# Patient Record
Sex: Male | Born: 1950
Health system: Southern US, Community
[De-identification: ages and names within clinical notes are randomized; demographics above are authoritative.]

## PROBLEM LIST (undated history)

## (undated) ENCOUNTER — Ambulatory Visit: Source: Home / Self Care

## (undated) DIAGNOSIS — H269 Unspecified cataract: Secondary | ICD-10-CM

## (undated) DIAGNOSIS — E119 Type 2 diabetes mellitus without complications: Secondary | ICD-10-CM

## (undated) DIAGNOSIS — E785 Hyperlipidemia, unspecified: Secondary | ICD-10-CM

## (undated) HISTORY — DX: Type 2 diabetes mellitus without complications: E11.9

## (undated) HISTORY — DX: Unspecified cataract: H26.9

## (undated) HISTORY — DX: Hyperlipidemia, unspecified: E78.5

## (undated) HISTORY — PX: STRABISMUS SURGERY: SHX218

---

## 2005-01-16 ENCOUNTER — Ambulatory Visit: Payer: Self-pay | Admitting: Internal Medicine

## 2007-07-01 HISTORY — PX: COLONOSCOPY: SHX174

## 2008-03-16 ENCOUNTER — Observation Stay (HOSPITAL_COMMUNITY): Admission: EM | Admit: 2008-03-16 | Discharge: 2008-03-17 | Payer: Self-pay | Admitting: Emergency Medicine

## 2008-03-16 ENCOUNTER — Ambulatory Visit: Payer: Self-pay | Admitting: Internal Medicine

## 2008-03-17 ENCOUNTER — Encounter: Payer: Self-pay | Admitting: Internal Medicine

## 2008-03-21 ENCOUNTER — Encounter: Payer: Self-pay | Admitting: Internal Medicine

## 2010-07-30 NOTE — Procedures (Signed)
Summary: Colonoscopy   Colonoscopy  Procedure date:  03/17/2008  Findings:      Location:  [facility].    Procedures Next Due Date:    Colonoscopy: 03/2018  Patient Name: Norman Bradley, Norman Bradley MRN: [id number] Procedure Procedures: Colonoscopy CPT: 47829.    with polypectomy. CPT: A3573898.  Personnel: Endoscopist: Iva Boop, MD, [facility].  Exam Location: Exam performed in Endoscopy Suite. Outpatient  Patient Consent: Procedure, Alternatives, Risks and Benefits discussed, consent obtained, from patient. Consent was obtained by the RN.  Indications Symptoms: Hematochezia.  History  Current Medications: Patient is not currently taking Coumadin.  Pre-Exam Physical: Performed Mar 17, 2008. Cardio-pulmonary exam WNL. Rectal exam abnormal. HEENT exam , Abdominal exam, Mental status exam WNL. Abnormal PE findings include: scanty red blood on rectal.  Comments: Pt. history reviewed/updated, physical exam performed prior to initiation of sedation? YES Exam Exam: Extent of exam reached: Cecum, extent intended: Cecum.  The cecum was identified by appendiceal orifice and IC valve. Patient position: on left side. Time to Cecum: 00:08:11. Time for Withdrawl: 00:09:32. Colon retroflexion performed. Images taken. ASA Classification: I. Tolerance: good.  Monitoring: Pulse and BP monitoring, Oximetry used. Supplemental O2 given.  Colon Prep Used MiraLax for colon prep. Prep results: excellent.  Sedation Meds: Patient assessed and found to be appropriate for moderate (conscious) sedation. Fentanyl 60 given IV. Versed 6 mg. given IV.  Findings - NORMAL EXAM: Cecum to Sigmoid Colon.  POLYP: Rectum, Maximum size: 4 mm. sessile polyp. Procedure:  snare without cautery, removed, retrieved, Polyp sent to pathology.  HEMORRHOIDS: Internal. Size: Grade I.    Comments: No blood seen after rectal completed Assessment  Comments: 1) 4 MM RECTAL POLYP REMOVED 2) INTERNAL  HEMORHOIDS, I THINK THESE WERE SOURCE OF BLEEDING 3) OTHERWISE NORMAL COLONOSCOPY, EXCELLENT PREP Events  Unplanned Interventions: No intervention was required.  Plans Comments: ANUSOL HC 25 MG AT BEDTIME X 7 NIGHTS (AND AFTER BOWEL MOVEMENT PRN)  Disposition: After procedure patient sent to recovery.  Scheduling/Referral: Await pathology to schedule patient.  Comments: IF POLYP ADENOMA, REPEAT COLONOSCOPY 5 YRS  IF HYPERPLASTIC, 10 YRS   cc: Garlan Fillers, MD    This report was created from the original endoscopy report, which was reviewed and signed by the above listed endoscopist.

## 2010-07-30 NOTE — Letter (Signed)
Summary: Patient Notice-Hyperplastic Polyps  [facility]  [address] [place], [place] [address]   Phone: [phone]  Fax: [phone]        March 21, 2008 MRN: [id number]    [facility] [address] [place], [place]  [address]    Dear Mr. Shackleton,  I am pleased to inform you that the colon polyp removed during your recent colonoscopy was hyperplastic.  These types of polyps are NOT pre-cancerous.  It is therefore my recommendation that you have a repeat colonoscopy examination in 10 years for routine colorectal cancer screening.  Should you develop new or worsening symptoms of abdominal pain, bowel habit changes or bleeding from the rectum or bowels, please schedule an evaluation with either your primary care physician or with me.  Continue the treatment plan for hemorrhoids as outlined the day of your exam.  Please call us if you are having persistent problems or have questions about your condition that have not been fully answered at this time.  Sincerely,  Iva Boop MD  This letter has been electronically signed by your physician.  Appended Document: Patient Notice-Hyperplastic Polyps letter mailed to pt home

## 2011-03-31 LAB — CBC
HCT: 47.8
Hemoglobin: 16.5
MCHC: 34.5
MCV: 91.5
Platelets: 164
RBC: 5.22
RDW: 12.6
WBC: 6.1

## 2011-03-31 LAB — SAMPLE TO BLOOD BANK

## 2011-03-31 LAB — PROTIME-INR
INR: 1.1
Prothrombin Time: 14.3

## 2011-03-31 LAB — APTT: aPTT: 33

## 2011-03-31 LAB — COMPREHENSIVE METABOLIC PANEL
ALT: 45
AST: 38 — ABNORMAL HIGH
Albumin: 3.7
Alkaline Phosphatase: 64
BUN: 13
CO2: 26
Calcium: 9.1
Chloride: 108
Creatinine, Ser: 1.02
GFR calc Af Amer: >60
GFR calc non Af Amer: >60
Glucose, Bld: 114 — ABNORMAL HIGH
Potassium: 4.6
Sodium: 139
Total Bilirubin: 1.7 — ABNORMAL HIGH
Total Protein: 6.7

## 2011-03-31 LAB — DIFFERENTIAL
Basophils Absolute: 0
Basophils Relative: 1
Eosinophils Absolute: 0.1
Eosinophils Relative: 3
Lymphocytes Relative: 32
Lymphs Abs: 2
Monocytes Absolute: 0.5
Monocytes Relative: 9
Neutro Abs: 3.4
Neutrophils Relative %: 56

## 2011-03-31 LAB — HEMOGLOBIN AND HEMATOCRIT, BLOOD
HCT: 46
Hemoglobin: 15.9

## 2014-04-25 ENCOUNTER — Encounter: Payer: Self-pay | Admitting: Endocrinology

## 2014-04-25 ENCOUNTER — Ambulatory Visit (INDEPENDENT_AMBULATORY_CARE_PROVIDER_SITE_OTHER): Payer: BC Managed Care – PPO | Admitting: Endocrinology

## 2014-04-25 VITALS — BP 128/87 | HR 95 | Temp 98.1°F | Ht 71.0 in | Wt 246.0 lb

## 2014-04-25 DIAGNOSIS — E119 Type 2 diabetes mellitus without complications: Secondary | ICD-10-CM

## 2014-04-25 LAB — HEMOGLOBIN A1C: Hgb A1c MFr Bld: 6.5 % (ref 4.6–6.5)

## 2014-04-25 NOTE — Progress Notes (Signed)
Subjective:    Patient ID: Norman Bradley, male    DOB: 02-13-1951, 63 y.o.   MRN: [id number]  HPI pt states DM was dx'ed in mid-2015; he has mild if any neuropathy of the lower extremities; he is unaware of any associated chronic complications; he has never been on insulin; pt says his diet and exercise are very good; he has never had pancreatitis, severe hypoglycemia or DKA.  He was rx'ed metformin.  cbg's since then have been in the low-100's in am.  He declines weight-loss surgery.   No past medical history on file.  No past surgical history on file.  History   Social History  . Marital Status: Single    Spouse Name: N/A    Number of Children: N/A  . Years of Education: N/A   Occupational History  . Not on file.   Social History Main Topics  . Smoking status: Former Research scientist (life sciences)  . Smokeless tobacco: Not on file  . Alcohol Use: No  . Drug Use: Not on file  . Sexual Activity: Not on file   Other Topics Concern  . Not on file   Social History Narrative  . No narrative on file    No current outpatient prescriptions on file prior to visit.   No current facility-administered medications on file prior to visit.    Not on File  Family History  Problem Relation Age of Onset  . Diabetes Maternal Grandmother   . Diabetes Maternal Grandfather     BP 128/87  Pulse 95  Temp(Src) 98.1 F (36.7 C) (Oral)  Ht 5\' 11"  (1.803 m)  Wt 246 lb (111.585 kg)  BMI 34.33 kg/m2  SpO2 97%  Review of Systems denies fever, blurry vision, headache, chest pain, sob, n/v, urinary frequency, muscle cramps, excessive diaphoresis, depression, cold intolerance, rhinorrhea, and easy bruising.  He has lost a few lbs, due to his efforts.     Objective:   Physical Exam VS: see vs page GEN: no distress HEAD: head: no deformity eyes: no periorbital swelling, no proptosis external nose and ears are normal mouth: no lesion seen NECK: supple, thyroid is not enlarged CHEST WALL: no  deformity LUNGS: clear to auscultation CV: reg rate and rhythm, no murmur ABD: abdomen is soft, nontender.  no hepatosplenomegaly.  not distended.  no hernia MUSCULOSKELETAL: muscle bulk and strength are grossly normal.  no obvious joint swelling.  gait is normal and steady EXTEMITIES: no deformity.  no ulcer on the feet.  feet are of normal color and temp.  no edema PULSES: dorsalis pedis intact bilat.  no carotid bruit NEURO:  cn 2-12 grossly intact.   readily moves all 4's.  sensation is intact to touch on the feet SKIN:  Normal texture and temperature.  No rash or suspicious lesion is visible.   NODES:  None palpable at the neck PSYCH: alert, well-oriented.  Does not appear anxious nor depressed.   i have reviewed the following old records: Office notes  i reviewed electrocardiogram  Lab Results  Component Value Date   HGBA1C 6.5 04/25/2014      Assessment & Plan:  DM: new to me.  Glycemic control is much better  Patient is advised the following: Patient Instructions  blood tests are being requested for you today.  We'll contact you with results. good diet and exercise habits significanly improve the control of your diabetes.  please let me know if you wish to be referred to a dietician.  high blood  sugar is very risky to your health.  you should see an eye doctor and dentist every year.  It is very important to get all recommended vaccinations.  controlling your blood pressure and cholesterol drastically reduces the damage diabetes does to your body.  Those who smoke should quit.  please discuss these with your doctor.  check your blood sugar once a day.  vary the time of day when you check, between before the 3 meals, and at bedtime.  also check if you have symptoms of your blood sugar being too high or too low.  please keep a record of the readings and bring it to your next appointment here.  You can write it on any piece of paper.  please call us sooner if your blood sugar goes  below 70, or if you have a lot of readings over 200.   I would be happy to see you back here whenever you want.

## 2014-04-25 NOTE — Patient Instructions (Signed)
blood tests are being requested for you today.  We'll contact you with results. good diet and exercise habits significanly improve the control of your diabetes.  please let me know if you wish to be referred to a dietician.  high blood sugar is very risky to your health.  you should see an eye doctor and dentist every year.  It is very important to get all recommended vaccinations.  controlling your blood pressure and cholesterol drastically reduces the damage diabetes does to your body.  Those who smoke should quit.  please discuss these with your doctor.  check your blood sugar once a day.  vary the time of day when you check, between before the 3 meals, and at bedtime.  also check if you have symptoms of your blood sugar being too high or too low.  please keep a record of the readings and bring it to your next appointment here.  You can write it on any piece of paper.  please call us sooner if your blood sugar goes below 70, or if you have a lot of readings over 200.   I would be happy to see you back here whenever you want.

## 2014-12-25 ENCOUNTER — Other Ambulatory Visit: Payer: Self-pay

## 2017-05-27 ENCOUNTER — Ambulatory Visit (INDEPENDENT_AMBULATORY_CARE_PROVIDER_SITE_OTHER): Payer: Medicare Other | Admitting: Family Medicine

## 2017-05-27 ENCOUNTER — Encounter: Payer: Self-pay | Admitting: Family Medicine

## 2017-05-27 VITALS — BP 130/80 | HR 96 | Temp 98.4°F | Ht 71.0 in | Wt 252.4 lb

## 2017-05-27 DIAGNOSIS — Z125 Encounter for screening for malignant neoplasm of prostate: Secondary | ICD-10-CM

## 2017-05-27 DIAGNOSIS — Z0000 Encounter for general adult medical examination without abnormal findings: Secondary | ICD-10-CM | POA: Diagnosis not present

## 2017-05-27 DIAGNOSIS — R0683 Snoring: Secondary | ICD-10-CM

## 2017-05-27 DIAGNOSIS — Z1159 Encounter for screening for other viral diseases: Secondary | ICD-10-CM | POA: Insufficient documentation

## 2017-05-27 DIAGNOSIS — R7989 Other specified abnormal findings of blood chemistry: Secondary | ICD-10-CM

## 2017-05-27 DIAGNOSIS — E119 Type 2 diabetes mellitus without complications: Secondary | ICD-10-CM | POA: Diagnosis not present

## 2017-05-27 DIAGNOSIS — E7800 Pure hypercholesterolemia, unspecified: Secondary | ICD-10-CM

## 2017-05-27 DIAGNOSIS — R945 Abnormal results of liver function studies: Secondary | ICD-10-CM

## 2017-05-27 DIAGNOSIS — Z1211 Encounter for screening for malignant neoplasm of colon: Secondary | ICD-10-CM

## 2017-05-27 LAB — CBC
HCT: 51 % (ref 39.0–52.0)
Hemoglobin: 17.2 g/dL — ABNORMAL HIGH (ref 13.0–17.0)
MCHC: 33.8 g/dL (ref 30.0–36.0)
MCV: 90.9 fl (ref 78.0–100.0)
Platelets: 165 10*3/uL (ref 150.0–400.0)
RBC: 5.61 Mil/uL (ref 4.22–5.81)
RDW: 14 % (ref 11.5–15.5)
WBC: 8 10*3/uL (ref 4.0–10.5)

## 2017-05-27 LAB — URINALYSIS, ROUTINE W REFLEX MICROSCOPIC
Bilirubin Urine: NEGATIVE
Hgb urine dipstick: NEGATIVE
Ketones, ur: NEGATIVE
Leukocytes, UA: NEGATIVE
Nitrite: NEGATIVE
RBC / HPF: NONE SEEN (ref 0–?)
Specific Gravity, Urine: 1.025 (ref 1.000–1.030)
Total Protein, Urine: NEGATIVE
Urine Glucose: NEGATIVE
Urobilinogen, UA: 0.2 (ref 0.0–1.0)
WBC, UA: NONE SEEN (ref 0–?)
pH: 5.5 (ref 5.0–8.0)

## 2017-05-27 LAB — COMPREHENSIVE METABOLIC PANEL
ALT: 61 U/L — ABNORMAL HIGH (ref 0–53)
AST: 38 U/L — ABNORMAL HIGH (ref 0–37)
Albumin: 4.5 g/dL (ref 3.5–5.2)
Alkaline Phosphatase: 65 U/L (ref 39–117)
BUN: 21 mg/dL (ref 6–23)
CO2: 29 mEq/L (ref 19–32)
Calcium: 9.9 mg/dL (ref 8.4–10.5)
Chloride: 98 mEq/L (ref 96–112)
Creatinine, Ser: 1.04 mg/dL (ref 0.40–1.50)
GFR: 75.91 mL/min (ref >60.00)
Glucose, Bld: 161 mg/dL — ABNORMAL HIGH (ref 70–99)
Potassium: 4.8 mEq/L (ref 3.5–5.1)
Sodium: 136 mEq/L (ref 135–145)
Total Bilirubin: 1.8 mg/dL — ABNORMAL HIGH (ref 0.2–1.2)
Total Protein: 7.9 g/dL (ref 6.0–8.3)

## 2017-05-27 LAB — TSH: TSH: 3.48 u[IU]/mL (ref 0.35–4.50)

## 2017-05-27 LAB — MICROALBUMIN / CREATININE URINE RATIO
Creatinine,U: 109.4 mg/dL
Microalb Creat Ratio: 2.5 mg/g (ref 0.0–30.0)
Microalb, Ur: 2.8 mg/dL — ABNORMAL HIGH (ref 0.0–1.9)

## 2017-05-27 LAB — LIPID PANEL
Cholesterol: 164 mg/dL (ref 0–200)
HDL: 27.8 mg/dL — ABNORMAL LOW (ref >39.00)
NonHDL: 136.23
Total CHOL/HDL Ratio: 6
Triglycerides: 255 mg/dL — ABNORMAL HIGH (ref 0.0–149.0)
VLDL: 51 mg/dL — ABNORMAL HIGH (ref 0.0–40.0)

## 2017-05-27 LAB — HEMOGLOBIN A1C: Hgb A1c MFr Bld: 7.1 % — ABNORMAL HIGH (ref 4.6–6.5)

## 2017-05-27 LAB — PSA: PSA: 1.22 ng/mL (ref 0.10–4.00)

## 2017-05-27 LAB — LDL CHOLESTEROL, DIRECT: Direct LDL: 99 mg/dL

## 2017-05-27 MED ORDER — METFORMIN HCL 1000 MG PO TABS: 1000.0000 mg | ORAL_TABLET | Freq: Two times a day (BID) | ORAL | 0 refills | Status: DC

## 2017-05-27 NOTE — Progress Notes (Addendum)
Subjective:  Patient ID: Norman Bradley, male    DOB: 07-30-1950  Age: 66 y.o. MRN: [id number]  CC: Establish Care   HPI Norman Bradley presents to establish care. Longstanding ho dm tw metformin alone. Has gained weight and cbgs have been running in the 140-160 range. Has not been exercising recently other than playing golf. He does walk for golf. Recent eye exam. nml colonoscopy 5 years ago. Cholesterol has been high and not treated. He snores but feels rested in am. Wife doesn't report apnea but she snores too. He does take an pm nap. Fh largely unknown. He has 7 children and 14 grandchildren and 2 great grandchildren. Not interested in retiring. He runs a company. He has had all vaccines he says.   History Norman Bradley has a past medical history of Diabetes mellitus without complication ([place]).   He has no past surgical history on file.   His family history includes Alcohol abuse in his mother; Cancer in his brother and mother; Depression in his mother; Diabetes in his maternal grandfather and maternal grandmother; Early death in his mother.He reports that he has quit smoking. he has never used smokeless tobacco. He reports that he drinks alcohol. He reports that he does not use drugs.  Outpatient Medications Prior to Visit  Medication Sig Dispense Refill  . metFORMIN (GLUCOPHAGE) 500 MG tablet Take by mouth 4 (four) times daily.     No facility-administered medications prior to visit.     ROS Review of Systems  Constitutional: Negative.   HENT: Negative.   Eyes: Negative.   Respiratory: Negative for chest tightness and shortness of breath.   Cardiovascular: Negative.  Negative for chest pain and palpitations.  Gastrointestinal: Negative.   Endocrine: Negative for cold intolerance, polyphagia and polyuria.  Genitourinary: Negative for decreased urine volume, difficulty urinating, frequency and hematuria.  Skin: Negative.   Allergic/Immunologic: Negative for  immunocompromised state.  Neurological: Negative for weakness and headaches.  Hematological: Negative.   Psychiatric/Behavioral: Negative.     Objective:  BP 130/80 (BP Location: Left Arm, Patient Position: Sitting, Cuff Size: Normal)   Pulse 96   Temp 98.4 F (36.9 C) (Oral)   Ht 5\' 11"  (1.803 m)   Wt 252 lb 6 oz (114.5 kg)   SpO2 97%   BMI 35.20 kg/m   Physical Exam  Constitutional: He is oriented to person, place, and time. He appears well-developed and well-nourished. No distress.  HENT:  Head: Normocephalic and atraumatic.  Right Ear: External ear normal.  Left Ear: External ear normal.  Mouth/Throat: Oropharynx is clear and moist. No oropharyngeal exudate.    Eyes: Conjunctivae are normal. Pupils are equal, round, and reactive to light. Right eye exhibits no discharge. Left eye exhibits no discharge. No scleral icterus.  Neck: Neck supple. No JVD present. No tracheal deviation present. No thyromegaly present.    Cardiovascular: Normal rate, regular rhythm and normal heart sounds.  Pulmonary/Chest: Effort normal and breath sounds normal. No stridor.  Abdominal: Bowel sounds are normal.  Musculoskeletal: Normal range of motion. He exhibits no edema or tenderness.  Lymphadenopathy:    He has no cervical adenopathy.  Neurological: He is alert and oriented to person, place, and time.  Skin: Skin is warm and dry. He is not diaphoretic.  Psychiatric: He has a normal mood and affect. His behavior is normal.      Assessment & Plan:   Norman Bradley was seen today for establish care.  Diagnoses and all orders for this visit:  Type 2 diabetes mellitus without complication, without long-term current use of insulin (HCC) -     CBC -     Comprehensive metabolic panel -     Hemoglobin A1c -     Lipid panel -     TSH -     Urinalysis, Routine w reflex microscopic -     Microalbumin / creatinine urine ratio -     metFORMIN (GLUCOPHAGE) 1000 MG tablet; Take 1 tablet (1,000 mg  total) by mouth 2 (two) times daily with a meal.  Screen for colon cancer  Screening for prostate cancer -     PSA  Healthcare maintenance -     Lipid panel -     TSH  Snores -     Cancel: Ambulatory referral to Pulmonology -     Ambulatory referral to Pulmonology  Encounter for hepatitis C screening test for low risk patient -     Hepatitis C antibody  Elevated LDL cholesterol level -     atorvastatin (LIPITOR) 20 MG tablet; Take 1 tablet (20 mg total) by mouth daily.  Elevated LFTs -     Hepatitis B Core Antibody, total -     Hepatitis B Surface AntiGEN -     Ethyl Glucuronide, Urine  Other orders -     LDL cholesterol, direct  He knows that losing weight is key here! I have discontinued Norman Bradley. Norman Bradley's metFORMIN. I am also having him start on metFORMIN and atorvastatin.  Meds ordered this encounter  Medications  . metFORMIN (GLUCOPHAGE) 1000 MG tablet    Sig: Take 1 tablet (1,000 mg total) by mouth 2 (two) times daily with a meal.    Dispense:  180 tablet    Refill:  0  . atorvastatin (LIPITOR) 20 MG tablet    Sig: Take 1 tablet (20 mg total) by mouth daily.    Dispense:  90 tablet    Refill:  0     Follow-up: Return in about 3 months (around 08/27/2017).  Libby Maw, MD

## 2017-05-28 ENCOUNTER — Telehealth: Payer: Self-pay | Admitting: Family Medicine

## 2017-05-28 DIAGNOSIS — E7800 Pure hypercholesterolemia, unspecified: Secondary | ICD-10-CM | POA: Insufficient documentation

## 2017-05-28 DIAGNOSIS — R945 Abnormal results of liver function studies: Secondary | ICD-10-CM

## 2017-05-28 DIAGNOSIS — R7989 Other specified abnormal findings of blood chemistry: Secondary | ICD-10-CM | POA: Insufficient documentation

## 2017-05-28 LAB — HEPATITIS C ANTIBODY
Hepatitis C Ab: NONREACTIVE
SIGNAL TO CUT-OFF: 0.01 (ref <1.00)

## 2017-05-28 MED ORDER — ATORVASTATIN CALCIUM 20 MG PO TABS: 20.0000 mg | ORAL_TABLET | Freq: Every day | ORAL | 0 refills | Status: DC

## 2017-05-28 NOTE — Addendum Note (Signed)
Addended by: Jon Billings on: 05/28/2017 08:11 AM   Modules accepted: Orders

## 2017-05-28 NOTE — Telephone Encounter (Signed)
Pt did not want to start Lipitor at this time. Wants to do diet and exercise. Pt states he did have 1 glass of wine the evening before labs.   Results given    Notes recorded by Libby Maw, MD on 05/28/2017 at 9:49 AM EST DM is under worse control with increased weight. Please exercise and lose weight. Continue glucophage only for now. Fu in 3 mos.  Ldl cholesterol and needs treatment. Have sent rx for lipitor to pharmacy. Fu in 3 mos.  Lfts are elevated. Did pt have any alcohol on the evening prior to being seen here at the clinic?

## 2017-05-28 NOTE — Telephone Encounter (Signed)
FYI

## 2017-05-29 ENCOUNTER — Telehealth: Payer: Self-pay | Admitting: Family Medicine

## 2017-05-29 NOTE — Telephone Encounter (Signed)
Copied from [place] [phone]. Topic: Quick Communication - See Telephone Encounter >> May 29, 2017  3:47 PM Synthia Innocent wrote: CRM for notification. See Telephone encounter for: Requesting refill on test strips, one touch  05/29/17.

## 2017-06-01 MED ORDER — GLUCOSE BLOOD VI STRP: ORAL_STRIP | 3 refills | Status: DC

## 2017-06-01 NOTE — Telephone Encounter (Signed)
Okay for test strips.

## 2017-06-01 NOTE — Telephone Encounter (Signed)
Pt requesting refill on one touch test strips.Test strips not listed on current medication list.

## 2017-06-01 NOTE — Telephone Encounter (Signed)
Rx has been sent in and patient is aware 

## 2017-06-01 NOTE — Telephone Encounter (Signed)
Okay to refill test strips 

## 2017-06-02 ENCOUNTER — Telehealth: Payer: Self-pay

## 2017-06-02 NOTE — Telephone Encounter (Signed)
I have called patient and left message for him to call and schedule appointment with Dr. Ethelene Hal.

## 2017-06-02 NOTE — Progress Notes (Signed)
Phone note created 

## 2017-06-02 NOTE — Telephone Encounter (Signed)
Can you please help Mr. Norman Bradley to get on Dr. Bebe Shaggy schedule? He would like to discuss some results with him in person & possibly order more blood work.   Thank you!

## 2017-06-04 ENCOUNTER — Encounter: Payer: Self-pay | Admitting: Family Medicine

## 2017-06-04 NOTE — Telephone Encounter (Signed)
Message sent to patient

## 2017-06-16 NOTE — Telephone Encounter (Signed)
I called and left message on patient voicemail to call office and schedule appointment.  °

## 2017-08-20 ENCOUNTER — Encounter: Payer: Self-pay | Admitting: Neurology

## 2017-08-20 ENCOUNTER — Ambulatory Visit: Payer: Medicare Other | Admitting: Neurology

## 2017-08-20 VITALS — BP 145/91 | HR 76 | Ht 71.0 in | Wt 251.0 lb

## 2017-08-20 DIAGNOSIS — E669 Obesity, unspecified: Secondary | ICD-10-CM | POA: Diagnosis not present

## 2017-08-20 DIAGNOSIS — R0683 Snoring: Secondary | ICD-10-CM | POA: Diagnosis not present

## 2017-08-20 DIAGNOSIS — R0681 Apnea, not elsewhere classified: Secondary | ICD-10-CM

## 2017-08-20 NOTE — Progress Notes (Signed)
Subjective:    Patient ID: Norman Bradley is a 67 y.o. male.  HPI     Norman Age, Norman Bradley, Norman Bradley [facility] [address] [place], [place] [address]  Dear Dr. Ethelene Hal,   I saw your patient, Norman Bradley, upon your kind request in my neurologic clinic today for initial consultation of his sleep disorder, in particular, concern for underlying obstructive sleep apnea. The patient is unaccompanied today. As you know, Norman Bradley is a 67 year old right-handed gentleman with an underlying medical history of type 2 diabetes, and obesity, who reports snoring and occasional sleep disruption, he does have vivid dreams, attributes this to his long time in the [employer]. His wife has noted the occasional positive in his breathing. He does not have any night to night nocturia and denies morning headaches or family history of obstructive sleep apnea, biological father's medical history is unknown to him. He is not sure at what Bradley his mom passed away. He quit smoking cigarettes some 35 years ago but smokes the occasional cigar. His weight fluctuates, typically a little higher in the winter months. He resides in [place] typically. His Epworth sleepiness score is 8 out of 24 today, fatigue score is 15 out of 63. Bedtime is around 10, he occasionally watches TV in bed. Rise time is 6:30. He has 7 children, none live in the household, he has no pets. He drinks caffeine in the form of coffee, typically 1 cup per day on average, occasional soda. He drinks alcohol in the form of wine typically, maybe 1 or 2 glasses a day or so. I reviewed your office note from 05/27/2017.  His Past Medical History Is Significant For: Past Medical History:  Diagnosis Date  . Diabetes mellitus without complication ([place])     His Past Surgical History Is Significant For: No past surgical history on file.  His Family History Is Significant For: Family History  Problem Relation Bradley of  Onset  . Diabetes Maternal Grandmother   . Diabetes Maternal Grandfather   . Alcohol abuse Mother   . Cancer Mother   . Depression Mother   . Early death Mother   . Cancer Brother     His Social History Is Significant For: Social History   Socioeconomic History  . Marital status: Single    Spouse name: None  . Number of children: None  . Years of education: None  . Highest education level: None  Social Needs  . Financial resource strain: None  . Food insecurity - worry: None  . Food insecurity - inability: None  . Transportation needs - medical: None  . Transportation needs - non-medical: None  Occupational History  . None  Tobacco Use  . Smoking status: Former Research scientist (life sciences)  . Smokeless tobacco: Never Used  Substance and Sexual Activity  . Alcohol use: Yes  . Drug use: No  . Sexual activity: Yes    Partners: Female  Other Topics Concern  . None  Social History Narrative  . None    His Allergies Are:  No Known Allergies:   His Current Medications Are:  Outpatient Encounter Medications as of 08/20/2017  Medication Sig  . aspirin EC 81 MG tablet Take 81 mg by mouth daily.  Marland Kitchen glucose blood (ONE TOUCH ULTRA TEST) test strip Use to test blood sugars once daily.  . metFORMIN (GLUCOPHAGE) 1000 MG tablet Take 1 tablet (1,000 mg total) by mouth 2 (two) times daily with a meal.  . [DISCONTINUED]  atorvastatin (LIPITOR) 20 MG tablet Take 1 tablet (20 mg total) by mouth daily.   No facility-administered encounter medications on file as of 08/20/2017.   :  Review of Systems:  Out of a complete 14 point review of systems, all are reviewed and negative with the exception of these symptoms as listed below: Review of Systems  Neurological:       Pt presents today to discuss his snoring. Pt has never had a sleep study but thinks that he does snore. Pt is not interested in using a cpap.  Epworth Sleepiness Scale 0= would never doze 1= slight chance of dozing 2= moderate chance of  dozing 3= high chance of dozing  Sitting and reading: 1 Watching TV: 2 Sitting inactive in a public place (ex. Theater or meeting): 0  As a passenger in a car for an hour without a break: 2 Lying down to rest in the afternoon: 3 Sitting and talking to someone: 0 Sitting quietly after lunch (no alcohol): 0 In a car, while stopped in traffic: 0 Total: 8     Objective:  Neurological Exam  Physical Exam Physical Examination:   Vitals:   08/20/17 1049  BP: (!) 145/91  Pulse: 76    General Examination: The patient is a very pleasant 67 y.o. male in no acute distress. He appears well-developed and well-nourished and well groomed.   HEENT: Normocephalic, atraumatic, pupils are equal, round and reactive to light and accommodation. Funduscopic exam is normal with sharp disc margins noted. Extraocular tracking is good without limitation to gaze excursion or nystagmus noted. Normal smooth pursuit is noted. Hearing is grossly intact. Face is symmetric with normal facial animation and normal facial sensation. Speech is clear with no dysarthria noted. There is no hypophonia. There is no lip, neck/head, jaw or voice tremor. Neck is supple with full range of passive and active motion. There are no carotid bruits on auscultation. Oropharynx exam reveals: mild mouth dryness, adequate dental hygiene and mild airway crowding, due to smaller airway entry, thicker tongue, longer uvula, which ends wisp-like. Mallampati is class II. Tongue protrudes centrally and palate elevates symmetrically. Tonsils are very small or ?absent. Neck size is 17 5/8 inches. He has a mild to maybe moderate overbite.   Chest: Clear to auscultation without wheezing, rhonchi or crackles noted.  Heart: S1+S2+0, regular and normal without murmurs, rubs or gallops noted.   Abdomen: Soft, non-tender and non-distended with normal bowel sounds appreciated on auscultation.  Extremities: There is no pitting edema in the distal lower  extremities bilaterally. Pedal pulses are intact.  Skin: Warm and dry without trophic changes noted.  Musculoskeletal: exam reveals no obvious joint deformities, tenderness or joint swelling or erythema.   Neurologically:  Mental status: The patient is awake, alert and oriented in all 4 spheres. His immediate and remote memory, attention, language skills and fund of knowledge are appropriate. There is no evidence of aphasia, agnosia, apraxia or anomia. Speech is clear with normal prosody and enunciation. Thought process is linear. Mood is normal and affect is normal.  Cranial nerves II - XII are as described above under HEENT exam. In addition: shoulder shrug is normal with equal shoulder height noted. Motor exam: Normal bulk, strength and tone is noted. There is no drift, tremor or rebound. Fine motor skills and coordination: intact with normal finger taps, normal hand movements, normal rapid alternating patting, normal foot taps and normal foot agility.  Cerebellar testing: No dysmetria or intention tremor on finger to  nose testing. Heel to shin is unremarkable bilaterally. There is no truncal or gait ataxia.  Sensory exam: intact to light touch in the upper and lower extremities.  Gait, station and balance: He stands easily. No veering to one side is noted. No leaning to one side is noted. Posture is Bradley-appropriate and stance is narrow based. Gait shows normal stride length and normal pace. No problems turning are noted.   Assessment and Plan:  In summary, Norman Bradley is a very pleasant 67 y.o.-year old male with an underlying medical history of type 2 diabetes, and obesity, whose history and physical exam are concerning for obstructive sleep apnea (OSA). I had a long chat with the patient about my findings and the diagnosis of OSA, its prognosis and treatment options. We talked about medical treatments, surgical interventions and non-pharmacological approaches. I explained in particular  the risks and ramifications of untreated moderate to severe OSA, especially with respect to developing cardiovascular disease down the Road, including congestive heart failure, difficult to treat hypertension, cardiac arrhythmias, or stroke. Even type 2 diabetes has, in part, been linked to untreated OSA. Symptoms of untreated OSA include daytime sleepiness, memory problems, mood irritability and mood disorder such as depression and anxiety, lack of energy, as well as recurrent headaches, especially morning headaches. We talked about trying to maintain a healthy lifestyle in general, as well as the importance of weight control. I encouraged the patient to eat healthy, exercise daily and keep well hydrated, to keep a scheduled bedtime and wake time routine, to not skip any meals and eat healthy snacks in between meals. I advised the patient not to drive when feeling sleepy. I recommended the following at this time: sleep study with potential positive airway pressure titration. (We will score hypopneas at 4%).   I explained the sleep test procedure to the patient and also outlined possible surgical and non-surgical treatment options of OSA, including the use of a custom-made dental device (which would require a referral to a specialist dentist or oral surgeon), upper airway surgical options, such as pillar implants, radiofrequency surgery, tongue base surgery, and UPPP (which would involve a referral to an ENT surgeon). Rarely, jaw surgery such as mandibular advancement may be considered.  I also explained the CPAP treatment option to the patient, who indicated that he would be reluctant, but willing to try CPAP if the need arises. I explained the importance of being compliant with PAP treatment, not only for insurance purposes but primarily to improve His symptoms, and for the patient's long term health benefit, including to reduce His cardiovascular risks. I answered all his questions today and the patient was  in agreement. I would like to see him back after the sleep study is completed and encouraged him to call with any interim questions, concerns, problems or updates.   Thank you very much for allowing me to participate in the care of this nice patient. If I can be of any further assistance to you please do not hesitate to call me at [phone].  Sincerely,   Norman Age, Norman Bradley, Norman Bradley

## 2017-08-20 NOTE — Patient Instructions (Addendum)
Thank you for choosing [facility] for your sleep related care! It was nice to meet you today! I appreciate that you entrust me with your sleep related healthcare concerns. I hope, I was able to address at least some of your concerns today, and that I can help you feel reassured and also get better.    Here is what we discussed today and what we came up with as our plan for you:    Based on your symptoms and your exam I believe you are at risk for obstructive sleep apnea or OSA, and I think we should proceed with a sleep study to determine whether you do or do not have OSA and how severe it is. If you have more than mild OSA, I want you to consider treatment with CPAP. Please remember, the risks and ramifications of moderate to severe obstructive sleep apnea or OSA are: Cardiovascular disease, including congestive heart failure, stroke, difficult to control hypertension, arrhythmias, and even type 2 diabetes has been linked to untreated OSA. Sleep apnea causes disruption of sleep and sleep deprivation in most cases, which, in turn, can cause recurrent headaches, problems with memory, mood, concentration, focus, and vigilance. Most people with untreated sleep apnea report excessive daytime sleepiness, which can affect their ability to drive. Please do not drive if you feel sleepy.   I will likely see you back after your sleep study to go over the test results and where to go from there. We will call you after your sleep study to advise about the results (most likely, you will hear from Kristen, my nurse) and to set up an appointment at the time, as necessary.    Our sleep lab administrative assistan will call you to schedule your sleep study. If you don't hear back from her by about 2 weeks from now, please feel free to call her at [phone]. You can leave a message with your phone number and concerns, if you get the voicemail box. She will call back as soon as possible.   

## 2017-08-24 ENCOUNTER — Ambulatory Visit (INDEPENDENT_AMBULATORY_CARE_PROVIDER_SITE_OTHER): Payer: Medicare Other | Admitting: Family Medicine

## 2017-08-24 ENCOUNTER — Encounter: Payer: Self-pay | Admitting: Family Medicine

## 2017-08-24 VITALS — BP 120/90 | HR 70 | Wt 249.6 lb

## 2017-08-24 DIAGNOSIS — E7800 Pure hypercholesterolemia, unspecified: Secondary | ICD-10-CM

## 2017-08-24 DIAGNOSIS — R945 Abnormal results of liver function studies: Secondary | ICD-10-CM

## 2017-08-24 DIAGNOSIS — R7989 Other specified abnormal findings of blood chemistry: Secondary | ICD-10-CM

## 2017-08-24 DIAGNOSIS — R0683 Snoring: Secondary | ICD-10-CM

## 2017-08-24 DIAGNOSIS — E119 Type 2 diabetes mellitus without complications: Secondary | ICD-10-CM

## 2017-08-24 LAB — LIPID PANEL
Cholesterol: 164 mg/dL (ref 0–200)
HDL: 30 mg/dL — ABNORMAL LOW (ref >39.00)
LDL Cholesterol: 101 mg/dL — ABNORMAL HIGH (ref 0–99)
NonHDL: 134.13
Total CHOL/HDL Ratio: 5
Triglycerides: 165 mg/dL — ABNORMAL HIGH (ref 0.0–149.0)
VLDL: 33 mg/dL (ref 0.0–40.0)

## 2017-08-24 LAB — COMPREHENSIVE METABOLIC PANEL
ALT: 58 U/L — ABNORMAL HIGH (ref 0–53)
AST: 30 U/L (ref 0–37)
Albumin: 4.3 g/dL (ref 3.5–5.2)
Alkaline Phosphatase: 70 U/L (ref 39–117)
BUN: 18 mg/dL (ref 6–23)
CO2: 31 mEq/L (ref 19–32)
Calcium: 9.9 mg/dL (ref 8.4–10.5)
Chloride: 99 mEq/L (ref 96–112)
Creatinine, Ser: 1.13 mg/dL (ref 0.40–1.50)
GFR: 68.93 mL/min (ref >60.00)
Glucose, Bld: 186 mg/dL — ABNORMAL HIGH (ref 70–99)
Potassium: 5 mEq/L (ref 3.5–5.1)
Sodium: 137 mEq/L (ref 135–145)
Total Bilirubin: 1.2 mg/dL (ref 0.2–1.2)
Total Protein: 7.6 g/dL (ref 6.0–8.3)

## 2017-08-24 LAB — HEMOGLOBIN A1C: Hgb A1c MFr Bld: 6.7 % — ABNORMAL HIGH (ref 4.6–6.5)

## 2017-08-24 MED ORDER — METFORMIN HCL 1000 MG PO TABS: 1000.0000 mg | ORAL_TABLET | Freq: Two times a day (BID) | ORAL | 1 refills | Status: DC

## 2017-08-24 NOTE — Patient Instructions (Addendum)
Cholesterol Cholesterol is a white, waxy, fat-like substance that is needed by the human body in small amounts. The liver makes all the cholesterol we need. Cholesterol is carried from the liver by the blood through the blood vessels. Deposits of cholesterol (plaques) may build up on blood vessel (artery) walls. Plaques make the arteries narrower and stiffer. Cholesterol plaques increase the risk for heart attack and stroke. You cannot feel your cholesterol level even if it is very high. The only way to know that it is high is to have a blood test. Once you know your cholesterol levels, you should keep a record of the test results. Work with your health care provider to keep your levels in the desired range. What do the results mean?  Total cholesterol is a rough measure of all the cholesterol in your blood.  LDL (low-density lipoprotein) is the "bad" cholesterol. This is the type that causes plaque to build up on the artery walls. You want this level to be low.  HDL (high-density lipoprotein) is the "good" cholesterol because it cleans the arteries and carries the LDL away. You want this level to be high.  Triglycerides are fat that the body can either burn for energy or store. High levels are closely linked to heart disease. What are the desired levels of cholesterol?  Total cholesterol below 200.  LDL below 100 for people who are at risk, below 70 for people at very high risk.  HDL above 40 is good. A level of 60 or higher is considered to be protective against heart disease.  Triglycerides below 150. How can I lower my cholesterol? Diet Follow your diet program as told by your health care provider.  Choose fish or white meat chicken and [place], roasted or baked. Limit fatty cuts of red meat, fried foods, and processed meats, such as sausage and lunch meats.  Eat lots of fresh fruits and vegetables.  Choose whole grains, beans, pasta, potatoes, and cereals.  Choose olive oil, corn  oil, or canola oil, and use only small amounts.  Avoid butter, mayonnaise, shortening, or palm kernel oils.  Avoid foods with trans fats.  Drink skim or nonfat milk and eat low-fat or nonfat yogurt and cheeses. Avoid whole milk, cream, ice cream, egg yolks, and full-fat cheeses.  Healthier desserts include angel food cake, ginger snaps, animal crackers, hard candy, popsicles, and low-fat or nonfat frozen yogurt. Avoid pastries, cakes, pies, and cookies.  Exercise  Follow your exercise program as told by your health care provider. A regular program: ? Helps to decrease LDL and raise HDL. ? Helps with weight control.  Do things that increase your activity level, such as gardening, walking, and taking the stairs.  Ask your health care provider about ways that you can be more active in your daily life.  Medicine  Take over-the-counter and prescription medicines only as told by your health care provider. ? Medicine may be prescribed by your health care provider to help lower cholesterol and decrease the risk for heart disease. This is usually done if diet and exercise have failed to bring down cholesterol levels. ? If you have several risk factors, you may need medicine even if your levels are normal.  This information is not intended to replace advice given to you by your health care provider. Make sure you discuss any questions you have with your health care provider. Document Released: 03/11/2001 Document Revised: 01/12/2016 Document Reviewed: 12/15/2015 Elsevier Interactive Patient Education  2018 [place] and  Cholesterol Restricted Diet High levels of fat and cholesterol in your blood may lead to various health problems, such as diseases of the heart, blood vessels, gallbladder, liver, and pancreas. Fats are concentrated sources of energy that come in various forms. Certain types of fat, including saturated fat, may be harmful in excess. Cholesterol is a substance needed by  your body in small amounts. Your body makes all the cholesterol it needs. Excess cholesterol comes from the food you eat. When you have high levels of cholesterol and saturated fat in your blood, health problems can develop because the excess fat and cholesterol will gather along the walls of your blood vessels, causing them to narrow. Choosing the right foods will help you control your intake of fat and cholesterol. This will help keep the levels of these substances in your blood within normal limits and reduce your risk of disease. What is my plan? Your health care provider recommends that you:  Limit your fat intake to ______% or less of your total calories per day.  Limit the amount of cholesterol in your diet to less than _________mg per day.  Eat 20-30 grams of fiber each day.  What types of fat should I choose?  Choose healthy fats more often. Choose monounsaturated and polyunsaturated fats, such as olive and canola oil, flaxseeds, walnuts, almonds, and seeds.  Eat more omega-3 fats. Good choices include salmon, mackerel, sardines, tuna, flaxseed oil, and ground flaxseeds. Aim to eat fish at least two times a week.  Limit saturated fats. Saturated fats are primarily found in animal products, such as meats, butter, and cream. Plant sources of saturated fats include palm oil, palm kernel oil, and coconut oil.  Avoid foods with partially hydrogenated oils in them. These contain trans fats. Examples of foods that contain trans fats are stick margarine, some tub margarines, cookies, crackers, and other baked goods. What general guidelines do I need to follow? These guidelines for healthy eating will help you control your intake of fat and cholesterol:  Check food labels carefully to identify foods with trans fats or high amounts of saturated fat.  Fill one half of your plate with vegetables and green salads.  Fill one fourth of your plate with whole grains. Look for the word "whole" as  the first word in the ingredient list.  Fill one fourth of your plate with lean protein foods.  Limit fruit to two servings a day. Choose fruit instead of juice.  Eat more foods that contain fiber, such as apples, broccoli, carrots, beans, peas, and barley.  Eat more home-cooked food and less restaurant, buffet, and fast food.  Limit or avoid alcohol.  Limit foods high in starch and sugar.  Limit fried foods.  Cook foods using methods other than frying. Baking, boiling, grilling, and broiling are all great options.  Lose weight if you are overweight. Losing just 5-10% of your initial body weight can help your overall health and prevent diseases such as diabetes and heart disease.  What foods can I eat? Grains  Whole grains, such as whole wheat or whole grain breads, crackers, cereals, and pasta. Unsweetened oatmeal, bulgur, barley, quinoa, or brown rice. Corn or whole wheat flour tortillas. Vegetables  Fresh or frozen vegetables (raw, steamed, roasted, or grilled). Green salads. Fruits  All fresh, canned (in natural juice), or frozen fruits. Meats and other protein foods  Ground beef (85% or leaner), grass-fed beef, or beef trimmed of fat. Skinless chicken or [place]. Ground chicken or [place]. Pork  trimmed of fat. All fish and seafood. Eggs. Dried beans, peas, or lentils. Unsalted nuts or seeds. Unsalted canned or dry beans. Dairy  Low-fat dairy products, such as skim or 1% milk, 2% or reduced-fat cheeses, low-fat ricotta or cottage cheese, or plain low-fat yo Fats and oils  Tub margarines without trans fats. Light or reduced-fat mayonnaise and salad dressings. Avocado. Olive, canola, sesame, or safflower oils. Natural peanut or almond butter (choose ones without added sugar and oil). The items listed above may not be a complete list of recommended foods or beverages. Contact your dietitian for more options. Foods to avoid Grains  White bread. White pasta. White rice.  Cornbread. Bagels, pastries, and croissants. Crackers that contain trans fat. Vegetables  White potatoes. Corn. Creamed or fried vegetables. Vegetables in a cheese sauce. Fruits  Dried fruits. Canned fruit in light or heavy syrup. Fruit juice. Meats and other protein foods  Fatty cuts of meat. Ribs, chicken wings, bacon, sausage, bologna, salami, chitterlings, fatback, hot dogs, bratwurst, and packaged luncheon meats. Liver and organ meats. Dairy  Whole or 2% milk, cream, half-and-half, and cream cheese. Whole milk cheeses. Whole-fat or sweetened yogurt. Full-fat cheeses. Nondairy creamers and whipped toppings. Processed cheese, cheese spreads, or cheese curds. Beverages  Alcohol. Sweetened drinks (such as sodas, lemonade, and fruit drinks or punches). Fats and oils  Butter, stick margarine, lard, shortening, ghee, or bacon fat. Coconut, palm kernel, or palm oils. Sweets and desserts  Corn syrup, sugars, honey, and molasses. Candy. Jam and jelly. Syrup. Sweetened cereals. Cookies, pies, cakes, donuts, muffins, and ice cream. The items listed above may not be a complete list of foods and beverages to avoid. Contact your dietitian for more information. This information is not intended to replace advice given to you by your health care provider. Make sure you discuss any questions you have with your health care provider. Document Released: 06/16/2005 Document Revised: 07/07/2014 Document Reviewed: 09/14/2013 Elsevier Interactive Patient Education  [employer].

## 2017-08-24 NOTE — Progress Notes (Signed)
Subjective:  Patient ID: Norman Bradley, male    DOB: Aug 09, 1950  Age: 67 y.o. MRN: [id number]  CC: Follow-up   HPI Norman Bradley presents for follow-up of his diabetes, elevated LDL cholesterol and elevated LFTs.  Last LDL was 99 but recommendations are that it be less than 70 with his history of diabetes.  He is not taking a statin at this time.  He continues to take the Glucophage.  He drinks alcohol only occasionally.  He does not use street drugs or smoke.  He is exercising in the gym.  He tells me that a sleep study is pending.  He does feel rested in the morning but admits that he has been told that he is a loud snorer. Outpatient Medications Prior to Visit  Medication Sig Dispense Refill  . aspirin EC 81 MG tablet Take 81 mg by mouth daily.    Marland Kitchen glucose blood (ONE TOUCH ULTRA TEST) test strip Use to test blood sugars once daily. 100 each 3  . metFORMIN (GLUCOPHAGE) 1000 MG tablet Take 1 tablet (1,000 mg total) by mouth 2 (two) times daily with a meal. 180 tablet 0   No facility-administered medications prior to visit.     ROS Review of Systems  Constitutional: Negative for chills, fatigue and unexpected weight change.  HENT: Negative.   Eyes: Negative.   Respiratory: Negative.   Cardiovascular: Negative.   Gastrointestinal: Negative.   Endocrine: Negative for polyphagia and polyuria.  Genitourinary: Negative.   Skin: Negative.   Psychiatric/Behavioral: Negative.     Objective:  BP 120/90 (BP Location: Right Arm, Patient Position: Sitting, Cuff Size: Large)   Pulse 70   Wt 249 lb 9.6 oz (113.2 kg)   BMI 34.81 kg/m   BP Readings from Last 3 Encounters:  08/24/17 120/90  08/20/17 (!) 145/91  05/27/17 130/80    Wt Readings from Last 3 Encounters:  08/24/17 249 lb 9.6 oz (113.2 kg)  08/20/17 251 lb (113.9 kg)  05/27/17 252 lb 6 oz (114.5 kg)    Physical Exam  Constitutional: He is oriented to person, place, and time. He appears well-developed and  well-nourished. No distress.  HENT:  Head: Normocephalic and atraumatic.  Right Ear: External ear normal.  Left Ear: External ear normal.  Mouth/Throat: Oropharynx is clear and moist. No oropharyngeal exudate.  Eyes: Conjunctivae are normal. Pupils are equal, round, and reactive to light. Right eye exhibits no discharge. Left eye exhibits no discharge. No scleral icterus.  Neck: Neck supple. No JVD present. No tracheal deviation present. No thyromegaly present.  Cardiovascular: Normal rate, regular rhythm and normal heart sounds.  Pulmonary/Chest: Effort normal and breath sounds normal. No respiratory distress. He has no wheezes. He has no rales.  Abdominal: Bowel sounds are normal.  Lymphadenopathy:    He has no cervical adenopathy.  Neurological: He is alert and oriented to person, place, and time.  Skin: Skin is warm and dry. He is not diaphoretic.  Psychiatric: He has a normal mood and affect.    Lab Results  Component Value Date   WBC 8.0 05/27/2017   HGB 17.2 (H) 05/27/2017   HCT 51.0 05/27/2017   PLT 165.0 05/27/2017   GLUCOSE 161 (H) 05/27/2017   CHOL 164 05/27/2017   TRIG 255.0 (H) 05/27/2017   HDL 27.80 (L) 05/27/2017   LDLDIRECT 99.0 05/27/2017   ALT 61 (H) 05/27/2017   AST 38 (H) 05/27/2017   NA 136 05/27/2017   K 4.8 05/27/2017  CL 98 05/27/2017   CREATININE 1.04 05/27/2017   BUN 21 05/27/2017   CO2 29 05/27/2017   TSH 3.48 05/27/2017   PSA 1.22 05/27/2017   INR 1.1 03/16/2008   HGBA1C 7.1 (H) 05/27/2017   MICROALBUR 2.8 (H) 05/27/2017    No results found.  Assessment & Plan:   Norman Bradley was seen today for follow-up.  Diagnoses and all orders for this visit:  Type 2 diabetes mellitus without complication, without long-term current use of insulin (HCC) -     Comprehensive metabolic panel -     Hemoglobin A1c -     metFORMIN (GLUCOPHAGE) 1000 MG tablet; Take 1 tablet (1,000 mg total) by mouth 2 (two) times daily with a meal.  Elevated LDL cholesterol  level -     Lipid panel  Elevated LFTs -     Comprehensive metabolic panel  Snores   I am having Norman Bradley. Norman Bradley maintain his glucose blood, aspirin EC, and metFORMIN.  Meds ordered this encounter  Medications  . metFORMIN (GLUCOPHAGE) 1000 MG tablet    Sig: Take 1 tablet (1,000 mg total) by mouth 2 (two) times daily with a meal.    Dispense:  180 tablet    Refill:  1  Patient requested 6 months of refills because he is currently living in [place].  Encouraged weight loss and that it would help his diabetes, LDL cholesterol and probably his liver enzymes.  Suggested that at some point we may want to get an ultrasound of his liver.   Follow-up: Return in about 6 months (around 02/21/2018).  Libby Maw, MD

## 2017-08-25 ENCOUNTER — Encounter: Payer: Self-pay | Admitting: Family Medicine

## 2017-09-07 ENCOUNTER — Telehealth: Payer: Self-pay | Admitting: Neurology

## 2017-09-07 NOTE — Telephone Encounter (Signed)
We have attempted to call the patient two times to schedule sleep study. Patient has been unavailable at the phone numbers we have on file, and has not returned our calls. At this time, we will send a letter asking patient to please contact the sleep lab.  °

## 2017-11-17 ENCOUNTER — Other Ambulatory Visit: Payer: Self-pay | Admitting: Family Medicine

## 2017-12-30 ENCOUNTER — Other Ambulatory Visit: Payer: Self-pay | Admitting: Family Medicine

## 2017-12-30 DIAGNOSIS — E119 Type 2 diabetes mellitus without complications: Secondary | ICD-10-CM

## 2018-02-22 ENCOUNTER — Ambulatory Visit: Payer: Medicare Other | Admitting: Family Medicine

## 2018-02-25 ENCOUNTER — Other Ambulatory Visit: Payer: Self-pay | Admitting: Family Medicine

## 2018-02-25 DIAGNOSIS — E119 Type 2 diabetes mellitus without complications: Secondary | ICD-10-CM

## 2018-02-25 MED ORDER — METFORMIN HCL 1000 MG PO TABS: 1000.0000 mg | ORAL_TABLET | Freq: Two times a day (BID) | ORAL | 1 refills | Status: DC

## 2018-02-26 ENCOUNTER — Ambulatory Visit (INDEPENDENT_AMBULATORY_CARE_PROVIDER_SITE_OTHER): Payer: Medicare Other | Admitting: Family Medicine

## 2018-02-26 ENCOUNTER — Encounter: Payer: Self-pay | Admitting: Family Medicine

## 2018-02-26 VITALS — BP 128/82 | HR 100 | Ht 71.0 in | Wt 248.5 lb

## 2018-02-26 DIAGNOSIS — E119 Type 2 diabetes mellitus without complications: Secondary | ICD-10-CM

## 2018-02-26 DIAGNOSIS — E7800 Pure hypercholesterolemia, unspecified: Secondary | ICD-10-CM

## 2018-02-26 DIAGNOSIS — T887XXA Unspecified adverse effect of drug or medicament, initial encounter: Secondary | ICD-10-CM | POA: Diagnosis not present

## 2018-02-26 DIAGNOSIS — R7989 Other specified abnormal findings of blood chemistry: Secondary | ICD-10-CM

## 2018-02-26 DIAGNOSIS — R945 Abnormal results of liver function studies: Secondary | ICD-10-CM

## 2018-02-26 LAB — COMPREHENSIVE METABOLIC PANEL
ALT: 42 U/L (ref 0–53)
AST: 30 U/L (ref 0–37)
Albumin: 4.5 g/dL (ref 3.5–5.2)
Alkaline Phosphatase: 54 U/L (ref 39–117)
BUN: 16 mg/dL (ref 6–23)
CO2: 30 mEq/L (ref 19–32)
Calcium: 9.6 mg/dL (ref 8.4–10.5)
Chloride: 99 mEq/L (ref 96–112)
Creatinine, Ser: 1.1 mg/dL (ref 0.40–1.50)
GFR: 70.99 mL/min (ref >60.00)
Glucose, Bld: 165 mg/dL — ABNORMAL HIGH (ref 70–99)
Potassium: 4.8 mEq/L (ref 3.5–5.1)
Sodium: 135 mEq/L (ref 135–145)
Total Bilirubin: 1.9 mg/dL — ABNORMAL HIGH (ref 0.2–1.2)
Total Protein: 7.8 g/dL (ref 6.0–8.3)

## 2018-02-26 LAB — LIPID PANEL
Cholesterol: 92 mg/dL (ref 0–200)
HDL: 28.4 mg/dL — ABNORMAL LOW (ref >39.00)
LDL Cholesterol: 35 mg/dL (ref 0–99)
NonHDL: 63.59
Total CHOL/HDL Ratio: 3
Triglycerides: 143 mg/dL (ref 0.0–149.0)
VLDL: 28.6 mg/dL (ref 0.0–40.0)

## 2018-02-26 LAB — HEMOGLOBIN A1C: Hgb A1c MFr Bld: 6.9 % — ABNORMAL HIGH (ref 4.6–6.5)

## 2018-02-26 MED ORDER — METFORMIN HCL 1000 MG PO TABS: 1000.0000 mg | ORAL_TABLET | Freq: Two times a day (BID) | ORAL | 1 refills | Status: DC

## 2018-02-26 MED ORDER — PRAVASTATIN SODIUM 20 MG PO TABS: 20.0000 mg | ORAL_TABLET | Freq: Every day | ORAL | 3 refills | Status: DC

## 2018-02-26 NOTE — Progress Notes (Signed)
Subjective:  Patient ID: Norman Bradley, male    DOB: 15-Jul-1950  Age: 67 y.o. MRN: [id number]  CC: Follow-up   HPI Norman Bradley presents for follow-up of his diabetes and elevated cholesterol.  Continues to tolerate the Glucophage well and diabetes is been controlled.  He has been dealing with myalgias with atorvastatin.  They have been they and mild.  He has altered through it and says that they have not been that bad.  He is fasting today.  He continues a heart healthy diet and a low carbohydrate diet.  He is not exercising as much as he would like but does continue physical activity by going to the gym at his club twice a week and playing golf.  He is achieving approximately 6000 steps daily.  Outpatient Medications Prior to Visit  Medication Sig Dispense Refill  . glucose blood (ONE TOUCH ULTRA TEST) test strip Use to test blood sugars once daily. 100 each 3  . atorvastatin (LIPITOR) 20 MG tablet TAKE 1 TABLET BY MOUTH ONCE DAILY 90 tablet 1  . metFORMIN (GLUCOPHAGE) 1000 MG tablet Take 1 tablet (1,000 mg total) by mouth 2 (two) times daily with a meal. 180 tablet 1  . aspirin EC 81 MG tablet Take 81 mg by mouth daily.     No facility-administered medications prior to visit.     ROS Review of Systems  Constitutional: Negative for chills, fatigue, fever and unexpected weight change.  Respiratory: Negative.   Cardiovascular: Negative.   Gastrointestinal: Negative.   Endocrine: Negative for polyphagia and polyuria.  Musculoskeletal: Positive for arthralgias and myalgias.  Skin: Negative for pallor and rash.  Neurological: Negative for weakness and light-headedness.  Hematological: Does not bruise/bleed easily.  Psychiatric/Behavioral: Negative.     Objective:  BP 128/82   Pulse 100   Ht 5\' 11"  (1.803 m)   Wt 248 lb 8 oz (112.7 kg)   SpO2 97%   BMI 34.66 kg/m   BP Readings from Last 3 Encounters:  02/26/18 128/82  08/24/17 120/90  08/20/17 (!) 145/91     Wt Readings from Last 3 Encounters:  02/26/18 248 lb 8 oz (112.7 kg)  08/24/17 249 lb 9.6 oz (113.2 kg)  08/20/17 251 lb (113.9 kg)    Physical Exam  Constitutional: He is oriented to person, place, and time. He appears well-developed and well-nourished. No distress.  HENT:  Head: Normocephalic and atraumatic.  Right Ear: External ear normal.  Left Ear: External ear normal.  Mouth/Throat: Oropharynx is clear and moist.  Eyes: Pupils are equal, round, and reactive to light. Conjunctivae are normal. Right eye exhibits no discharge. No scleral icterus.  Neck: No JVD present. No tracheal deviation present. No thyromegaly present.  Cardiovascular: Normal rate, regular rhythm and normal heart sounds.  Pulmonary/Chest: Effort normal and breath sounds normal.  Lymphadenopathy:    He has no cervical adenopathy.  Neurological: He is alert and oriented to person, place, and time.  Skin: Skin is warm and dry. No rash noted. He is not diaphoretic. No erythema. No pallor.  Psychiatric: He has a normal mood and affect. His behavior is normal.    Lab Results  Component Value Date   WBC 8.0 05/27/2017   HGB 17.2 (H) 05/27/2017   HCT 51.0 05/27/2017   PLT 165.0 05/27/2017   GLUCOSE 186 (H) 08/24/2017   CHOL 164 08/24/2017   TRIG 165.0 (H) 08/24/2017   HDL 30.00 (L) 08/24/2017   LDLDIRECT 99.0 05/27/2017   LDLCALC  101 (H) 08/24/2017   ALT 58 (H) 08/24/2017   AST 30 08/24/2017   NA 137 08/24/2017   K 5.0 08/24/2017   CL 99 08/24/2017   CREATININE 1.13 08/24/2017   BUN 18 08/24/2017   CO2 31 08/24/2017   TSH 3.48 05/27/2017   PSA 1.22 05/27/2017   INR 1.1 03/16/2008   HGBA1C 6.7 (H) 08/24/2017   MICROALBUR 2.8 (H) 05/27/2017    No results found.  Assessment & Plan:   Norman Bradley was seen today for follow-up.  Diagnoses and all orders for this visit:  Type 2 diabetes mellitus without complication, without long-term current use of insulin (HCC) -     Hemoglobin A1c -      Comprehensive metabolic panel -     metFORMIN (GLUCOPHAGE) 1000 MG tablet; Take 1 tablet (1,000 mg total) by mouth 2 (two) times daily with a meal.  Elevated LDL cholesterol level -     Lipid panel -     Comprehensive metabolic panel -     pravastatin (PRAVACHOL) 20 MG tablet; Take 1 tablet (20 mg total) by mouth daily.  Elevated LFTs -     Comprehensive metabolic panel  Medication side effect   I have discontinued Norman Bradley. Norman Bradley's aspirin EC and atorvastatin. I am also having him start on pravastatin. Additionally, I am having him maintain his glucose blood and metFORMIN.  Meds ordered this encounter  Medications  . metFORMIN (GLUCOPHAGE) 1000 MG tablet    Sig: Take 1 tablet (1,000 mg total) by mouth 2 (two) times daily with a meal.    Dispense:  180 tablet    Refill:  1  . pravastatin (PRAVACHOL) 20 MG tablet    Sig: Take 1 tablet (20 mg total) by mouth daily.    Dispense:  90 tablet    Refill:  3   We will go ahead and discontinue Lipitor and start pravastatin.  He will take the pravastatin at night.  Encouraged 30 minutes of exercise 5 days a week.  Follow-up visit 3 months.  If he develops myalgias taking the pravastatin I said that it would be okay to go ahead and take a 2-week break break to see if they resolved.  Told him it was okay to let me know before follow-up and I can change him to a different statin.  Follow-up: Return in about 3 months (around 05/29/2018).  Libby Maw, MD

## 2018-03-02 ENCOUNTER — Encounter: Payer: Self-pay | Admitting: Family Medicine

## 2018-03-02 NOTE — Telephone Encounter (Signed)
Forwarding

## 2018-03-23 ENCOUNTER — Other Ambulatory Visit: Payer: Self-pay | Admitting: Family Medicine

## 2018-06-07 ENCOUNTER — Encounter: Payer: Self-pay | Admitting: Family Medicine

## 2018-06-07 ENCOUNTER — Ambulatory Visit (INDEPENDENT_AMBULATORY_CARE_PROVIDER_SITE_OTHER): Payer: Medicare Other | Admitting: Family Medicine

## 2018-06-07 VITALS — BP 142/80 | HR 94 | Ht 71.0 in | Wt 248.0 lb

## 2018-06-07 DIAGNOSIS — Z125 Encounter for screening for malignant neoplasm of prostate: Secondary | ICD-10-CM

## 2018-06-07 DIAGNOSIS — E119 Type 2 diabetes mellitus without complications: Secondary | ICD-10-CM | POA: Diagnosis not present

## 2018-06-07 DIAGNOSIS — E7800 Pure hypercholesterolemia, unspecified: Secondary | ICD-10-CM

## 2018-06-07 DIAGNOSIS — R030 Elevated blood-pressure reading, without diagnosis of hypertension: Secondary | ICD-10-CM

## 2018-06-07 DIAGNOSIS — Z0000 Encounter for general adult medical examination without abnormal findings: Secondary | ICD-10-CM | POA: Diagnosis not present

## 2018-06-07 LAB — LIPID PANEL
Cholesterol: 102 mg/dL (ref 0–200)
HDL: 25.4 mg/dL — ABNORMAL LOW (ref >39.00)
NonHDL: 76.27
Total CHOL/HDL Ratio: 4
Triglycerides: 210 mg/dL — ABNORMAL HIGH (ref 0.0–149.0)
VLDL: 42 mg/dL — ABNORMAL HIGH (ref 0.0–40.0)

## 2018-06-07 LAB — COMPREHENSIVE METABOLIC PANEL
ALT: 53 U/L (ref 0–53)
AST: 30 U/L (ref 0–37)
Albumin: 4.1 g/dL (ref 3.5–5.2)
Alkaline Phosphatase: 68 U/L (ref 39–117)
BUN: 19 mg/dL (ref 6–23)
CO2: 26 mEq/L (ref 19–32)
Calcium: 9.2 mg/dL (ref 8.4–10.5)
Chloride: 103 mEq/L (ref 96–112)
Creatinine, Ser: 1.04 mg/dL (ref 0.40–1.50)
GFR: 75.67 mL/min (ref >60.00)
Glucose, Bld: 194 mg/dL — ABNORMAL HIGH (ref 70–99)
Potassium: 5 mEq/L (ref 3.5–5.1)
Sodium: 139 mEq/L (ref 135–145)
Total Bilirubin: 1 mg/dL (ref 0.2–1.2)
Total Protein: 7.1 g/dL (ref 6.0–8.3)

## 2018-06-07 LAB — CBC
HCT: 46.8 % (ref 39.0–52.0)
Hemoglobin: 15.8 g/dL (ref 13.0–17.0)
MCHC: 33.7 g/dL (ref 30.0–36.0)
MCV: 89.3 fl (ref 78.0–100.0)
Platelets: 160 10*3/uL (ref 150.0–400.0)
RBC: 5.24 Mil/uL (ref 4.22–5.81)
RDW: 13.4 % (ref 11.5–15.5)
WBC: 6.4 10*3/uL (ref 4.0–10.5)

## 2018-06-07 LAB — PSA: PSA: 1.4 ng/mL (ref 0.10–4.00)

## 2018-06-07 LAB — HEMOGLOBIN A1C: Hgb A1c MFr Bld: 6.9 % — ABNORMAL HIGH (ref 4.6–6.5)

## 2018-06-07 LAB — LDL CHOLESTEROL, DIRECT: Direct LDL: 49 mg/dL

## 2018-06-07 MED ORDER — METFORMIN HCL 1000 MG PO TABS: 1000.0000 mg | ORAL_TABLET | Freq: Two times a day (BID) | ORAL | 1 refills | Status: DC

## 2018-06-07 MED ORDER — TRANDOLAPRIL 1 MG PO TABS: 1.0000 mg | ORAL_TABLET | Freq: Every day | ORAL | 1 refills | Status: DC

## 2018-06-07 MED ORDER — PRAVASTATIN SODIUM 20 MG PO TABS: 20.0000 mg | ORAL_TABLET | Freq: Every day | ORAL | 3 refills | Status: DC

## 2018-06-07 NOTE — Progress Notes (Signed)
Established Patient Office Visit  Subjective:  Patient ID: Norman Bradley, male    DOB: 23-Dec-1950  Age: 67 y.o. MRN: [id number]  CC:  Chief Complaint  Patient presents with  . Follow-up    HPI Norman Bradley presents for follow up. Norman Bradley has been taking his medications as directed.  Follow-up of his diabetes elevated cholesterol and elevated blood pressure.  Patient has been tolerating the Glucophage and has had his diabetes under adequate control.  Norman Bradley is tolerating the pravastatin unusual muscle aches or other issues.  Blood pressure is elevated today.  We discussed that ACE inhibitors are indicated for renal protection in folks who are diabetic.  Norman Bradley has not had a eye exam in over a year.  Norman Bradley has been going to adopting a box and Norman Bradley is eye doctor left and Norman Bradley would like to find a visit with Norman Bradley is exercising by playing golf.  Norman Bradley does walk most of the time Norman Bradley continues to work.  Past Medical History:  Diagnosis Date  . Diabetes mellitus without complication ([place])     History reviewed. No pertinent surgical history.  Family History  Problem Relation Age of Onset  . Diabetes Maternal Grandmother   . Diabetes Maternal Grandfather   . Alcohol abuse Mother   . Cancer Mother   . Depression Mother   . Early death Mother   . Cancer Brother     Social History   Socioeconomic History  . Marital status: Single    Spouse name: Not on file  . Number of children: Not on file  . Years of education: Not on file  . Highest education level: Not on file  Occupational History  . Not on file  Social Needs  . Financial resource strain: Not on file  . Food insecurity:    Worry: Not on file    Inability: Not on file  . Transportation needs:    Medical: Not on file    Non-medical: Not on file  Tobacco Use  . Smoking status: Former Research scientist (life sciences)  . Smokeless tobacco: Never Used  Substance and Sexual Activity  . Alcohol use: Yes  . Drug use: No  . Sexual activity: Yes    Partners:  Female  Lifestyle  . Physical activity:    Days per week: Not on file    Minutes per session: Not on file  . Stress: Not on file  Relationships  . Social connections:    Talks on phone: Not on file    Gets together: Not on file    Attends religious service: Not on file    Active member of club or organization: Not on file    Attends meetings of clubs or organizations: Not on file    Relationship status: Not on file  . Intimate partner violence:    Fear of current or ex partner: Not on file    Emotionally abused: Not on file    Physically abused: Not on file    Forced sexual activity: Not on file  Other Topics Concern  . Not on file  Social History Narrative  . Not on file    Outpatient Medications Prior to Visit  Medication Sig Dispense Refill  . glucose blood (ONE TOUCH ULTRA TEST) test strip Use to test blood sugars once daily. 100 each 3  . metFORMIN (GLUCOPHAGE) 1000 MG tablet Take 1 tablet (1,000 mg total) by mouth 2 (two) times daily with a meal. 180 tablet 1  . pravastatin (PRAVACHOL)  20 MG tablet Take 1 tablet (20 mg total) by mouth daily. 90 tablet 3   No facility-administered medications prior to visit.     No Known Allergies  ROS Review of Systems  Constitutional: Negative for diaphoresis, fatigue, fever and unexpected weight change.  HENT: Negative.   Eyes: Negative for photophobia and visual disturbance.  Respiratory: Negative.   Cardiovascular: Negative.   Endocrine: Negative for polyphagia and polyuria.  Genitourinary: Negative.   Musculoskeletal: Negative for gait problem and myalgias.  Allergic/Immunologic: Negative for immunocompromised state.  Neurological: Negative for seizures, speech difficulty, light-headedness and numbness.  Hematological: Does not bruise/bleed easily.  Psychiatric/Behavioral: Negative.       Objective:    Physical Exam  Constitutional: Norman Bradley is oriented to person, place, and time. Norman Bradley appears well-developed and  well-nourished. No distress.  HENT:  Head: Normocephalic.  Right Ear: External ear normal.  Left Ear: External ear normal.  Nose: Nose normal.  Mouth/Throat: Oropharynx is clear and moist. No oropharyngeal exudate.  Eyes: Pupils are equal, round, and reactive to light. Conjunctivae are normal. Right eye exhibits no discharge. Left eye exhibits no discharge. No scleral icterus.  Neck: Neck supple. No JVD present. No tracheal deviation present. No thyromegaly present.  Cardiovascular: Normal rate, regular rhythm and normal heart sounds.  Pulmonary/Chest: Effort normal and breath sounds normal. No stridor.  Abdominal: Bowel sounds are normal. Norman Bradley exhibits no distension. There is no tenderness. There is no rebound and no guarding.  Musculoskeletal: Normal range of motion. Norman Bradley exhibits no edema, tenderness or deformity.  Lymphadenopathy:    Norman Bradley has no cervical adenopathy.  Neurological: Norman Bradley is alert and oriented to person, place, and time.  Skin: Skin is warm and dry. Norman Bradley is not diaphoretic.  Psychiatric: Norman Bradley has a normal mood and affect. His behavior is normal.    BP (!) 142/80   Pulse 94   Ht 5\' 11"  (1.803 m)   Wt 248 lb (112.5 kg)   SpO2 96%   BMI 34.59 kg/m  Wt Readings from Last 3 Encounters:  06/07/18 248 lb (112.5 kg)  02/26/18 248 lb 8 oz (112.7 kg)  08/24/17 249 lb 9.6 oz (113.2 kg)   BP Readings from Last 3 Encounters:  06/07/18 (!) 142/80  02/26/18 128/82  08/24/17 120/90    Health Maintenance Due  Topic Date Due  . OPHTHALMOLOGY EXAM  04/08/1961  . TETANUS/TDAP  04/08/1970  . COLONOSCOPY  04/08/2001  . FOOT EXAM  04/26/2015  . PNA vac Low Risk Adult (1 of 2 - PCV13) 04/08/2016  . INFLUENZA VACCINE  01/28/2018  . URINE MICROALBUMIN  05/27/2018    There are no preventive care reminders to display for this patient.  Lab Results  Component Value Date   TSH 3.48 05/27/2017   Lab Results  Component Value Date   WBC 8.0 05/27/2017   HGB 17.2 (H) 05/27/2017   HCT  51.0 05/27/2017   MCV 90.9 05/27/2017   PLT 165.0 05/27/2017   Lab Results  Component Value Date   NA 135 02/26/2018   K 4.8 02/26/2018   CO2 30 02/26/2018   GLUCOSE 165 (H) 02/26/2018   BUN 16 02/26/2018   CREATININE 1.10 02/26/2018   BILITOT 1.9 (H) 02/26/2018   ALKPHOS 54 02/26/2018   AST 30 02/26/2018   ALT 42 02/26/2018   PROT 7.8 02/26/2018   ALBUMIN 4.5 02/26/2018   CALCIUM 9.6 02/26/2018   GFR 70.99 02/26/2018   Lab Results  Component Value Date   CHOL  92 02/26/2018   Lab Results  Component Value Date   HDL 28.40 (L) 02/26/2018   Lab Results  Component Value Date   LDLCALC 35 02/26/2018   Lab Results  Component Value Date   TRIG 143.0 02/26/2018   Lab Results  Component Value Date   CHOLHDL 3 02/26/2018   Lab Results  Component Value Date   HGBA1C 6.9 (H) 02/26/2018      Assessment & Plan:   Problem List Items Addressed This Visit      Endocrine   Type 2 diabetes mellitus without complication, without long-term current use of insulin (HCC) - Primary   Relevant Medications   trandolapril (MAVIK) 1 MG tablet   metFORMIN (GLUCOPHAGE) 1000 MG tablet   pravastatin (PRAVACHOL) 20 MG tablet   Other Relevant Orders   CBC   Comprehensive metabolic panel   Hemoglobin A1c   Urinalysis, Routine w reflex microscopic   Microalbumin / creatinine urine ratio   Ambulatory referral to Ophthalmology     Other   Healthcare maintenance   Relevant Orders   PSA   Elevated LDL cholesterol level   Relevant Medications   pravastatin (PRAVACHOL) 20 MG tablet   Other Relevant Orders   Comprehensive metabolic panel   Lipid panel   Elevated BP without diagnosis of hypertension   Relevant Medications   trandolapril (MAVIK) 1 MG tablet   Other Relevant Orders   CBC   Comprehensive metabolic panel   Urinalysis, Routine w reflex microscopic   Microalbumin / creatinine urine ratio      Meds ordered this encounter  Medications  . trandolapril (MAVIK) 1 MG  tablet    Sig: Take 1 tablet (1 mg total) by mouth daily.    Dispense:  90 tablet    Refill:  1  . metFORMIN (GLUCOPHAGE) 1000 MG tablet    Sig: Take 1 tablet (1,000 mg total) by mouth 2 (two) times daily with a meal.    Dispense:  180 tablet    Refill:  1  . pravastatin (PRAVACHOL) 20 MG tablet    Sig: Take 1 tablet (20 mg total) by mouth daily.    Dispense:  90 tablet    Refill:  3   Patient was encouraged to continue exercising and losing weight.  Have added [place] at low dose for treatment of his elevated blood pressure and renal protection.  Follow-up in the 3 months. Follow-up: Return in about 3 months (around 09/06/2018).

## 2018-06-07 NOTE — Patient Instructions (Addendum)
DASH Eating Plan DASH stands for "Dietary Approaches to Stop Hypertension." The DASH eating plan is a healthy eating plan that has been shown to reduce high blood pressure (hypertension). It may also reduce your risk for type 2 diabetes, heart disease, and stroke. The DASH eating plan may also help with weight loss. What are tips for following this plan? General guidelines  Avoid eating more than 2,300 mg (milligrams) of salt (sodium) a day. If you have hypertension, you may need to reduce your sodium intake to 1,500 mg a day.  Limit alcohol intake to no more than 1 drink a day for nonpregnant women and 2 drinks a day for men. One drink equals 12 oz of beer, 5 oz of wine, or 1 oz of hard liquor.  Work with your health care provider to maintain a healthy body weight or to lose weight. Ask what an ideal weight is for you.  Get at least 30 minutes of exercise that causes your heart to beat faster (aerobic exercise) most days of the week. Activities may include walking, swimming, or biking.  Work with your health care provider or diet and nutrition specialist (dietitian) to adjust your eating plan to your individual calorie needs. Reading food labels  Check food labels for the amount of sodium per serving. Choose foods with less than 5 percent of the Daily Value of sodium. Generally, foods with less than 300 mg of sodium per serving fit into this eating plan.  To find whole grains, look for the word "whole" as the first word in the ingredient list. Shopping  Buy products labeled as "low-sodium" or "no salt added."  Buy fresh foods. Avoid canned foods and premade or frozen meals. Cooking  Avoid adding salt when cooking. Use salt-free seasonings or herbs instead of table salt or sea salt. Check with your health care provider or pharmacist before using salt substitutes.  Do not fry foods. Cook foods using healthy methods such as baking, boiling, grilling, and broiling instead.  Cook with  heart-healthy oils, such as olive, canola, soybean, or sunflower oil. Meal planning   Eat a balanced diet that includes: ? 5 or more servings of fruits and vegetables each day. At each meal, try to fill half of your plate with fruits and vegetables. ? Up to 6-8 servings of whole grains each day. ? Less than 6 oz of lean meat, poultry, or fish each day. A 3-oz serving of meat is about the same size as a deck of cards. One egg equals 1 oz. ? 2 servings of low-fat dairy each day. ? A serving of nuts, seeds, or beans 5 times each week. ? Heart-healthy fats. Healthy fats called Omega-3 fatty acids are found in foods such as flaxseeds and coldwater fish, like sardines, salmon, and mackerel.  Limit how much you eat of the following: ? Canned or prepackaged foods. ? Food that is high in trans fat, such as fried foods. ? Food that is high in saturated fat, such as fatty meat. ? Sweets, desserts, sugary drinks, and other foods with added sugar. ? Full-fat dairy products.  Do not salt foods before eating.  Try to eat at least 2 vegetarian meals each week.  Eat more home-cooked food and less restaurant, buffet, and fast food.  When eating at a restaurant, ask that your food be prepared with less salt or no salt, if possible. What foods are recommended? The items listed may not be a complete list. Talk with your dietitian about what   dietary choices are best for you. Grains Whole-grain or whole-wheat bread. Whole-grain or whole-wheat pasta. Brown rice. Oatmeal. Quinoa. Bulgur. Whole-grain and low-sodium cereals. Pita bread. Low-fat, low-sodium crackers. Whole-wheat flour tortillas. Vegetables Fresh or frozen vegetables (raw, steamed, roasted, or grilled). Low-sodium or reduced-sodium tomato and vegetable juice. Low-sodium or reduced-sodium tomato sauce and tomato paste. Low-sodium or reduced-sodium canned vegetables. Fruits All fresh, dried, or frozen fruit. Canned fruit in natural juice (without  added sugar). Meat and other protein foods Skinless chicken or turkey. Ground chicken or turkey. Pork with fat trimmed off. Fish and seafood. Egg whites. Dried beans, peas, or lentils. Unsalted nuts, nut butters, and seeds. Unsalted canned beans. Lean cuts of beef with fat trimmed off. Low-sodium, lean deli meat. Dairy Low-fat (1%) or fat-free (skim) milk. Fat-free, low-fat, or reduced-fat cheeses. Nonfat, low-sodium ricotta or cottage cheese. Low-fat or nonfat yogurt. Low-fat, low-sodium cheese. Fats and oils Soft margarine without trans fats. Vegetable oil. Low-fat, reduced-fat, or light mayonnaise and salad dressings (reduced-sodium). Canola, safflower, olive, soybean, and sunflower oils. Avocado. Seasoning and other foods Herbs. Spices. Seasoning mixes without salt. Unsalted popcorn and pretzels. Fat-free sweets. What foods are not recommended? The items listed may not be a complete list. Talk with your dietitian about what dietary choices are best for you. Grains Baked goods made with fat, such as croissants, muffins, or some breads. Dry pasta or rice meal packs. Vegetables Creamed or fried vegetables. Vegetables in a cheese sauce. Regular canned vegetables (not low-sodium or reduced-sodium). Regular canned tomato sauce and paste (not low-sodium or reduced-sodium). Regular tomato and vegetable juice (not low-sodium or reduced-sodium). Pickles. Olives. Fruits Canned fruit in a light or heavy syrup. Fried fruit. Fruit in cream or butter sauce. Meat and other protein foods Fatty cuts of meat. Ribs. Fried meat. Bacon. Sausage. Bologna and other processed lunch meats. Salami. Fatback. Hotdogs. Bratwurst. Salted nuts and seeds. Canned beans with added salt. Canned or smoked fish. Whole eggs or egg yolks. Chicken or turkey with skin. Dairy Whole or 2% milk, cream, and half-and-half. Whole or full-fat cream cheese. Whole-fat or sweetened yogurt. Full-fat cheese. Nondairy creamers. Whipped toppings.  Processed cheese and cheese spreads. Fats and oils Butter. Stick margarine. Lard. Shortening. Ghee. Bacon fat. Tropical oils, such as coconut, palm kernel, or palm oil. Seasoning and other foods Salted popcorn and pretzels. Onion salt, garlic salt, seasoned salt, table salt, and sea salt. Worcestershire sauce. Tartar sauce. Barbecue sauce. Teriyaki sauce. Soy sauce, including reduced-sodium. Steak sauce. Canned and packaged gravies. Fish sauce. Oyster sauce. Cocktail sauce. Horseradish that you find on the shelf. Ketchup. Mustard. Meat flavorings and tenderizers. Bouillon cubes. Hot sauce and Tabasco sauce. Premade or packaged marinades. Premade or packaged taco seasonings. Relishes. Regular salad dressings. Where to find more information:  National Heart, Lung, and Blood Institute: www.nhlbi.nih.gov  American Heart Association: www.heart.org Summary  The DASH eating plan is a healthy eating plan that has been shown to reduce high blood pressure (hypertension). It may also reduce your risk for type 2 diabetes, heart disease, and stroke.  With the DASH eating plan, you should limit salt (sodium) intake to 2,300 mg a day. If you have hypertension, you may need to reduce your sodium intake to 1,500 mg a day.  When on the DASH eating plan, aim to eat more fresh fruits and vegetables, whole grains, lean proteins, low-fat dairy, and heart-healthy fats.  Work with your health care provider or diet and nutrition specialist (dietitian) to adjust your eating plan to your individual   calorie needs. This information is not intended to replace advice given to you by your health care provider. Make sure you discuss any questions you have with your health care provider. Document Released: 06/05/2011 Document Revised: 06/09/2016 Document Reviewed: 06/09/2016 Elsevier Interactive Patient Education  2018 [employer].  Exercising to [employer] Exercising can help you to lose weight. In order to lose weight  through exercise, you need to do vigorous-intensity exercise. You can tell that you are exercising with vigorous intensity if you are breathing very hard and fast and cannot hold a conversation while exercising. Moderate-intensity exercise helps to maintain your current weight. You can tell that you are exercising at a moderate level if you have a higher heart rate and faster breathing, but you are still able to hold a conversation. How often should I exercise? Choose an activity that you enjoy and set realistic goals. Your health care provider can help you to make an activity plan that works for you. Exercise regularly as directed by your health care provider. This may include:  Doing resistance training twice each week, such as: ? Push-ups. ? Sit-ups. ? Lifting weights. ? Using resistance bands.  Doing a given intensity of exercise for a given amount of time. Choose from these options: ? 150 minutes of moderate-intensity exercise every week. ? 75 minutes of vigorous-intensity exercise every week. ? A mix of moderate-intensity and vigorous-intensity exercise every week.  Children, pregnant women, people who are out of shape, people who are overweight, and older adults may need to consult a health care provider for individual recommendations. If you have any sort of medical condition, be sure to consult your health care provider before starting a new exercise program. What are some activities that can help me to lose weight?  Walking at a rate of at least 4.5 miles an hour.  Jogging or running at a rate of 5 miles per hour.  Biking at a rate of at least 10 miles per hour.  Lap swimming.  Roller-skating or in-line skating.  Cross-country skiing.  Vigorous competitive sports, such as football, basketball, and soccer.  Jumping rope.  Aerobic dancing. How can I be more active in my day-to-day activities?  Use the stairs instead of the elevator.  Take a walk during your lunch  break.  If you drive, park your car farther away from work or school.  If you take public transportation, get off one stop early and walk the rest of the way.  Make all of your phone calls while standing up and walking around.  Get up, stretch, and walk around every 30 minutes throughout the day. What guidelines should I follow while exercising?  Do not exercise so much that you hurt yourself, feel dizzy, or get very short of breath.  Consult your health care provider prior to starting a new exercise program.  Wear comfortable clothes and shoes with good support.  Drink plenty of water while you exercise to prevent dehydration or heat stroke. Body water is lost during exercise and must be replaced.  Work out until you breathe faster and your heart beats faster. This information is not intended to replace advice given to you by your health care provider. Make sure you discuss any questions you have with your health care provider. Document Released: 07/19/2010 Document Revised: 11/22/2015 Document Reviewed: 11/17/2013 Elsevier Interactive Patient Education  2018 [employer]. Trandolapril tablets What is this medicine? TRANDOLAPRIL (tran DOLE a pril) is an ACE inhibitor. This medicine is used to  treat high blood pressure. It is also used to treat patients who have heart failure following a heart attack. This medicine may be used for other purposes; ask your health care provider or pharmacist if you have questions. COMMON BRAND NAME(S): Mavik What should I tell my health care provider before I take this medicine? They need to know if you have any of these conditions: -bone marrow disease -heart or blood vessel disease -if you are on a special diet, such as a low salt diet -immune system disease like lupus -kidney or liver disease -low blood pressure -previous swelling of the tongue, face, or lips with difficulty breathing, difficulty swallowing, hoarseness, or tightening of the  throat -an unusual or allergic reaction to trandolapril, other ACE inhibitors, insect venom, foods, dyes, or preservatives -pregnant or trying to get pregnant -breast-feeding How should I use this medicine? Take this medicine by mouth with a glass of water. Follow the directions on the prescription label. Take your doses at regular intervals. Do not take your medicine more often than directed. Do not stop taking this medicine except on the advice of your doctor or health care professional. Talk to your pediatrician regarding the use of this medicine in children. Special care may be needed. Overdosage: If you think you have taken too much of this medicine contact a poison control center or emergency room at once. NOTE: This medicine is only for you. Do not share this medicine with others. What if I miss a dose? If you miss a dose, take it as soon as you can. If it is almost time for your next dose, take only that dose. Do not take double or extra doses. What may interact with this medicine? Do not take this medication with any of the following medications: -sacubitril; valsartan This medicine may also interact with the following: -diuretics -lithium -medicines for high blood pressure -NSAIDs, medicines for pain and inflammation, like ibuprofen or naproxen -potassium salts or potassium supplements This list may not describe all possible interactions. Give your health care provider a list of all the medicines, herbs, non-prescription drugs, or dietary supplements you use. Also tell them if you smoke, drink alcohol, or use illegal drugs. Some items may interact with your medicine. What should I watch for while using this medicine? Visit your doctor or health care professional for regular checks on your progress. Check your blood pressure as directed. Ask your doctor or health care professional what your blood pressure should be and when you should contact him or her. Call your doctor or health care  professional if you notice an irregular or fast heart beat. Women should inform their doctor if they wish to become pregnant or think they might be pregnant. There is a potential for serious side effects to an unborn child. Talk to your health care professional or pharmacist for more information. Check with your doctor or health care professional if you get an attack of severe diarrhea, nausea and vomiting, or if you sweat a lot. The loss of too much body fluid can make it dangerous for you to take this medicine. You may get drowsy or dizzy. Do not drive, use machinery, or do anything that needs mental alertness until you know how this drug affects you. Do not stand or sit up quickly, especially if you are an older patient. This reduces the risk of dizzy or fainting spells. Alcohol can make you more drowsy and dizzy. Avoid alcoholic drinks. Avoid salt substitutes unless you are told otherwise by  your doctor or health care professional. Do not treat yourself for coughs, colds, or pain while you are taking this medicine without asking your doctor or health care professional for advice. Some ingredients may increase your blood pressure. What side effects may I notice from receiving this medicine? Side effects that you should report to your doctor or health care professional as soon as possible: -allergic reactions like skin rash or hives, swelling of the hands, feet, face, lips, throat, or tongue -chest pain -decreased amount of urine passed -difficulty breathing, or difficulty swallowing -dizziness, light headedness or fainting spells -fever or chills -numbness or tingling in your fingers or toes -nausea and vomiting -stomach or abdominal pain -swelling of your legs or ankles Side effects that usually do not require medical attention (report to your doctor or health care professional if they continue or are bothersome): -cough -decreased sexual function or desire -diarrhea -headache This list  may not describe all possible side effects. Call your doctor for medical advice about side effects. You may report side effects to FDA at 1-800-FDA-1088. Where should I keep my medicine? Keep out of the reach of children. Store at room temperature between 20 and 25 degrees C (68 and 77 degrees F). Keep container tightly closed. Throw away any unused medicine after the expiration date. NOTE: This sheet is a summary. It may not cover all possible information. If you have questions about this medicine, talk to your doctor, pharmacist, or health care provider.  2018 Elsevier/Gold Standard (2015-08-10 06:00:45)

## 2018-06-09 ENCOUNTER — Encounter: Payer: Self-pay | Admitting: Family Medicine

## 2018-06-17 ENCOUNTER — Encounter (INDEPENDENT_AMBULATORY_CARE_PROVIDER_SITE_OTHER): Payer: Self-pay | Admitting: Ophthalmology

## 2018-07-09 ENCOUNTER — Encounter (INDEPENDENT_AMBULATORY_CARE_PROVIDER_SITE_OTHER): Payer: Self-pay | Admitting: Ophthalmology

## 2018-07-09 LAB — HM DIABETES EYE EXAM

## 2018-07-10 NOTE — Progress Notes (Signed)
[facility] Note  07/13/2018     CHIEF COMPLAINT Patient presents for Diabetic Eye Exam   HISTORY OF PRESENT ILLNESS: Norman Bradley is a 68 y.o. male who presents to the clinic today for:   HPI    Diabetic Eye Exam    Vision is worsening, is blurred for distance and is blurred for near.  Associated Symptoms Negative for Flashes, Blind Spot, Photophobia, Scalp Tenderness, Fever, Floaters, Pain, Glare, Jaw Claudication, Weight Loss, Distortion, Redness, Trauma, Shoulder/Hip pain and Fatigue.  Diabetes characteristics include Type 2.  This started 2 years ago.  Blood sugar level fluctuates.  Last Blood Glucose: unknown.  Last A1C 6.7.  I, the attending physician,  performed the HPI with the patient and updated documentation appropriately.          Comments    Patient states referred for diabetic retinal eval. OD not correctable to 20/20 at eye exam and Dr. Iris Pert saw "pucker" in retina OD. Vision seems good OS. Denies floaters/flashes.        Last edited by Bernarda Caffey, MD on 07/13/2018  8:29 AM. (History)    pt was referred by his PCP, but states that he goes to [facility] for routine vision, he states his last exam with him was last Friday, he states the doctor told him he saw a "pucker" in his left eye and was unable to refract him to 20/20, he states he has been diabetic for 2-3 years, pts last A1C was 6.9, pt states he only takes metformin, pt states he plans to lose weight in order to lower A1C  Referring physician: Libby Maw, MD [place], [place] [id number]  HISTORICAL INFORMATION:   Selected notes from the [facility] Referred by Dr. Truman Hayward:  Ocular Hx- PMH-    CURRENT MEDICATIONS: No current outpatient medications on file. (Ophthalmic Drugs)   No current facility-administered medications for this visit.  (Ophthalmic Drugs)   Current Outpatient Medications (Other)   Medication Sig  . glucose blood (ONE TOUCH ULTRA TEST) test strip Use to test blood sugars once daily.  . metFORMIN (GLUCOPHAGE) 1000 MG tablet Take 1 tablet (1,000 mg total) by mouth 2 (two) times daily with a meal.  . pravastatin (PRAVACHOL) 20 MG tablet Take 1 tablet (20 mg total) by mouth daily.  . trandolapril (MAVIK) 1 MG tablet Take 1 tablet (1 mg total) by mouth daily.   No current facility-administered medications for this visit.  (Other)      REVIEW OF SYSTEMS: ROS    Positive for: Musculoskeletal, Endocrine, Eyes   Negative for: Constitutional, Gastrointestinal, Neurological, Skin, Genitourinary, HENT, Cardiovascular, Respiratory, Psychiatric, Allergic/Imm, Heme/Lymph   Last edited by Roselee Nova D on 07/13/2018  8:10 AM. (History)       ALLERGIES No Known Allergies  PAST MEDICAL HISTORY Past Medical History:  Diagnosis Date  . Diabetes mellitus without complication [facility])    Past Surgical History:  Procedure Laterality Date  . STRABISMUS SURGERY      FAMILY HISTORY Family History  Problem Relation Age of Onset  . Diabetes Maternal Grandmother   . Diabetes Maternal Grandfather   . Alcohol abuse Mother   . Cancer Mother   . Depression Mother   . Early death Mother   . Cancer Brother     SOCIAL HISTORY Social History   Tobacco Use  . Smoking status: Former Research scientist (life sciences)  . Smokeless tobacco: Never Used  Substance Use Topics  . Alcohol use: Yes  . Drug use: No         OPHTHALMIC EXAM:  Base Eye Exam    Visual Acuity (Snellen - Linear)      Right Left   Dist cc 20/25 20/20   Dist ph cc 20/25 +1    Correction:  Glasses       Tonometry (Tonopen, 8:23 AM)      Right Left   Pressure 15 14       Pupils      Dark Light Shape React APD   Right 3 2 Round Slow None   Left 3 2 Round Slow None       Visual Fields (Counting fingers)      Left Right    Full Full       Extraocular Movement      Right Left    Full, Ortho Full, Ortho        Neuro/Psych    Oriented x3:  Yes   Mood/Affect:  Normal       Dilation    Both eyes:  1.0% Mydriacyl, 2.5% Phenylephrine @ 8:24 AM        Slit Lamp and Fundus Exam    Slit Lamp Exam      Right Left   Lids/Lashes Dermatochalasis - upper lid Dermatochalasis - upper lid   Conjunctiva/Sclera White and quiet White and quiet   Cornea 1+ Punctate epithelial erosions 1+ Punctate epithelial erosions   Anterior Chamber deep and clear deep and clear   Iris Round and dilated, No NVI Round and dilated, No NVI   Lens 2+ Nuclear sclerosis, 2-3+ Cortical cataract 2+ Nuclear sclerosis, 2-3+ Cortical cataract   Vitreous mild Vitreous syneresis mild Vitreous syneresis       Fundus Exam      Right Left   Disc Pink and Sharp, focal fibrosis at 0730 Pink and Sharp   C/D Ratio 0.5 0.5   Macula Good foveal reflex, Retinal pigment epithelial mottling, No heme or edema Blunted foveal reflex, Retinal pigment epithelial mottling, no heme, trace cystic changes   Vessels mild Vascular attenuation, mild AV crossing changes mild Vascular attenuation, mild AV crossing changes   Periphery Attached, no heme Attached, no heme          Refraction    Wearing Rx      Sphere Cylinder Axis Add   Right +1.25 +1.00 027 +2.50   Left +1.25 +1.50 007 +2.50   Age:  1 yr   Type:  PAL       Manifest Refraction (Over)      Sphere Cylinder Axis Dist VA   Right +1.75 +0.75 045 20/20-1   Left +1.50 +1.50 007 20/20          IMAGING AND PROCEDURES  Imaging and Procedures for @TODAY @  OCT, Retina - OU - Both Eyes       Right Eye Quality was good. Central Foveal Thickness: 301. Progression has no prior data. Findings include normal foveal contour, no IRF, no SRF, vitreomacular adhesion .   Left Eye Quality was good. Central Foveal Thickness: 312. Progression has no prior data. Findings include normal foveal contour, no SRF, intraretinal fluid, vitreomacular adhesion .   Notes *Images captured and stored on  drive  Diagnosis / Impression:  OD: NFP; no IRF/SRF OS: NFP; tr central cystic changes  Clinical management:  See below  Abbreviations: NFP - Normal foveal profile. CME - cystoid macular edema.  PED - pigment epithelial detachment. IRF - intraretinal fluid. SRF - subretinal fluid. EZ - ellipsoid zone. ERM - epiretinal membrane. ORA - outer retinal atrophy. ORT - outer retinal tubulation. SRHM - subretinal hyper-reflective material                 ASSESSMENT/PLAN:    ICD-10-CM   1. Diabetes mellitus type 2 without retinopathy ([place]) E11.9   2. Retinal edema H35.81 OCT, Retina - OU - Both Eyes  3. Essential hypertension I10   4. Hypertensive retinopathy of both eyes H35.033   5. Combined forms of age-related cataract of both eyes H25.813   6. Hyperopia of both eyes with astigmatism and presbyopia H52.03    H52.203    H52.4     1,2. Diabetes mellitus, type 2 without retinopathy - The incidence, risk factors for progression, natural history and treatment options for diabetic retinopathy  were discussed with patient.   - The need for close monitoring of blood glucose, blood pressure, and serum lipids, avoiding cigarette or any type of tobacco, and the need for long term follow up was also discussed with patient. - OCT shows tr cystic changes OS - will monitor - f/u in 3-4 mos, sooner prn  3,4 Hypertensive retinopathy OU  - discussed importance of tight BP control  - monitor  5. Mixed form age related cataracts  - The symptoms of cataract, surgical options, and treatments and risks were discussed with patient.  - discussed diagnosis and progression  - not yet visually significant  - monitor for now  6. Hyperopia w/ astigmatism and presbyopia - under expert management at [place] Ordered this visit:  No orders of the defined types were placed in this encounter.      Return for f/u 3-4 months, NPDR OU, DFE, OCT.  There are no Patient  Instructions on file for this visit.   Explained the diagnoses, plan, and follow up with the patient and they expressed understanding.  Patient expressed understanding of the importance of proper follow up care.    This document serves as a record of services personally performed by Gardiner Sleeper, MD, PhD. It was created on their behalf by Ernest Mallick, OA, an ophthalmic assistant. The creation of this record is the provider's dictation and/or activities during the visit.    Electronically signed by: Ernest Mallick, OA  01.11.2020 9:25 AM    Gardiner Sleeper, M.D., Ph.D. Diseases & Surgery of the Retina and Vitreous [facility]  I have reviewed the above documentation for accuracy and completeness, and I agree with the above. Gardiner Sleeper, M.D., Ph.D. 07/13/18 9:25 AM   Abbreviations: M myopia (nearsighted); A astigmatism; H hyperopia (farsighted); P presbyopia; Mrx spectacle prescription;  CTL contact lenses; OD right eye; OS left eye; OU both eyes  XT exotropia; ET esotropia; PEK punctate epithelial keratitis; PEE punctate epithelial erosions; DES dry eye syndrome; MGD meibomian gland dysfunction; ATs artificial tears; PFAT's preservative free artificial tears; [place] nuclear sclerotic cataract; PSC posterior subcapsular cataract; ERM epi-retinal membrane; PVD posterior vitreous detachment; RD retinal detachment; DM diabetes mellitus; DR diabetic retinopathy; NPDR non-proliferative diabetic retinopathy; PDR proliferative diabetic retinopathy; CSME clinically significant macular edema; DME diabetic macular edema; dbh dot blot hemorrhages; CWS cotton wool spot; POAG primary open angle glaucoma; C/D cup-to-disc ratio; HVF humphrey visual field; GVF goldmann visual field; OCT optical coherence tomography; IOP intraocular pressure; BRVO Branch retinal vein occlusion; CRVO central retinal vein  occlusion; CRAO central retinal artery occlusion; BRAO branch retinal artery occlusion;  RT retinal tear; SB scleral buckle; PPV pars plana vitrectomy; VH Vitreous hemorrhage; PRP panretinal laser photocoagulation; IVK intravitreal kenalog; VMT vitreomacular traction; MH Macular hole;  NVD neovascularization of the disc; NVE neovascularization elsewhere; AREDS age related eye disease study; ARMD age related macular degeneration; POAG primary open angle glaucoma; EBMD epithelial/anterior basement membrane dystrophy; ACIOL anterior chamber intraocular lens; IOL intraocular lens; PCIOL posterior chamber intraocular lens; Phaco/IOL phacoemulsification with intraocular lens placement; [place] photorefractive keratectomy; LASIK laser assisted in situ keratomileusis; HTN hypertension; DM diabetes mellitus; COPD chronic obstructive pulmonary disease

## 2018-07-13 ENCOUNTER — Encounter (INDEPENDENT_AMBULATORY_CARE_PROVIDER_SITE_OTHER): Payer: Self-pay | Admitting: Ophthalmology

## 2018-07-13 ENCOUNTER — Ambulatory Visit (INDEPENDENT_AMBULATORY_CARE_PROVIDER_SITE_OTHER): Payer: Medicare Other | Admitting: Ophthalmology

## 2018-07-13 DIAGNOSIS — H3581 Retinal edema: Secondary | ICD-10-CM

## 2018-07-13 DIAGNOSIS — H35033 Hypertensive retinopathy, bilateral: Secondary | ICD-10-CM

## 2018-07-13 DIAGNOSIS — E119 Type 2 diabetes mellitus without complications: Secondary | ICD-10-CM | POA: Diagnosis not present

## 2018-07-13 DIAGNOSIS — I10 Essential (primary) hypertension: Secondary | ICD-10-CM

## 2018-07-13 DIAGNOSIS — H52203 Unspecified astigmatism, bilateral: Secondary | ICD-10-CM

## 2018-07-13 DIAGNOSIS — H5203 Hypermetropia, bilateral: Secondary | ICD-10-CM

## 2018-07-13 DIAGNOSIS — H25813 Combined forms of age-related cataract, bilateral: Secondary | ICD-10-CM

## 2018-07-13 DIAGNOSIS — H524 Presbyopia: Secondary | ICD-10-CM

## 2018-07-22 ENCOUNTER — Encounter: Payer: Self-pay | Admitting: Internal Medicine

## 2018-09-06 ENCOUNTER — Encounter: Payer: Self-pay | Admitting: Family Medicine

## 2018-09-06 ENCOUNTER — Ambulatory Visit (INDEPENDENT_AMBULATORY_CARE_PROVIDER_SITE_OTHER): Payer: Medicare Other | Admitting: Family Medicine

## 2018-09-06 VITALS — BP 126/70 | HR 82 | Ht 71.0 in | Wt 243.5 lb

## 2018-09-06 DIAGNOSIS — Z0000 Encounter for general adult medical examination without abnormal findings: Secondary | ICD-10-CM

## 2018-09-06 DIAGNOSIS — E7800 Pure hypercholesterolemia, unspecified: Secondary | ICD-10-CM

## 2018-09-06 DIAGNOSIS — E119 Type 2 diabetes mellitus without complications: Secondary | ICD-10-CM

## 2018-09-06 DIAGNOSIS — R030 Elevated blood-pressure reading, without diagnosis of hypertension: Secondary | ICD-10-CM

## 2018-09-06 LAB — COMPREHENSIVE METABOLIC PANEL
ALT: 55 U/L — ABNORMAL HIGH (ref 0–53)
AST: 30 U/L (ref 0–37)
Albumin: 4.3 g/dL (ref 3.5–5.2)
Alkaline Phosphatase: 57 U/L (ref 39–117)
BUN: 20 mg/dL (ref 6–23)
CO2: 27 mEq/L (ref 19–32)
Calcium: 9.4 mg/dL (ref 8.4–10.5)
Chloride: 102 mEq/L (ref 96–112)
Creatinine, Ser: 1.12 mg/dL (ref 0.40–1.50)
GFR: 65.31 mL/min (ref >60.00)
Glucose, Bld: 150 mg/dL — ABNORMAL HIGH (ref 70–99)
Potassium: 4.6 mEq/L (ref 3.5–5.1)
Sodium: 137 mEq/L (ref 135–145)
Total Bilirubin: 1.2 mg/dL (ref 0.2–1.2)
Total Protein: 7.3 g/dL (ref 6.0–8.3)

## 2018-09-06 LAB — URINALYSIS, ROUTINE W REFLEX MICROSCOPIC
Bilirubin Urine: NEGATIVE
Hgb urine dipstick: NEGATIVE
Ketones, ur: NEGATIVE
Leukocytes,Ua: NEGATIVE
Nitrite: NEGATIVE
Specific Gravity, Urine: 1.025 (ref 1.000–1.030)
Total Protein, Urine: NEGATIVE
Urine Glucose: NEGATIVE
Urobilinogen, UA: 4 — AB (ref 0.0–1.0)
pH: 6 (ref 5.0–8.0)

## 2018-09-06 LAB — HEMOGLOBIN A1C: Hgb A1c MFr Bld: 6.3 % (ref 4.6–6.5)

## 2018-09-06 LAB — CBC
HCT: 47.2 % (ref 39.0–52.0)
Hemoglobin: 16 g/dL (ref 13.0–17.0)
MCHC: 34 g/dL (ref 30.0–36.0)
MCV: 90 fl (ref 78.0–100.0)
Platelets: 146 10*3/uL — ABNORMAL LOW (ref 150.0–400.0)
RBC: 5.24 Mil/uL (ref 4.22–5.81)
RDW: 14 % (ref 11.5–15.5)
WBC: 6.4 10*3/uL (ref 4.0–10.5)

## 2018-09-06 LAB — MICROALBUMIN / CREATININE URINE RATIO
Creatinine,U: 162.6 mg/dL
Microalb Creat Ratio: 0.9 mg/g (ref 0.0–30.0)
Microalb, Ur: 1.4 mg/dL (ref 0.0–1.9)

## 2018-09-06 LAB — LDL CHOLESTEROL, DIRECT: Direct LDL: 56 mg/dL

## 2018-09-06 MED ORDER — TRANDOLAPRIL 1 MG PO TABS: 1.0000 mg | ORAL_TABLET | Freq: Every day | ORAL | 1 refills | Status: DC

## 2018-09-06 MED ORDER — METFORMIN HCL 1000 MG PO TABS: 1000.0000 mg | ORAL_TABLET | Freq: Two times a day (BID) | ORAL | 1 refills | Status: DC

## 2018-09-06 MED ORDER — PRAVASTATIN SODIUM 20 MG PO TABS: 20.0000 mg | ORAL_TABLET | Freq: Every day | ORAL | 3 refills | Status: DC

## 2018-09-06 NOTE — Progress Notes (Signed)
Established Patient Office Visit  Subjective:  Patient ID: Norman Bradley, male    DOB: November 29, 1950  Age: 68 y.o. MRN: [id number]  CC:  Chief Complaint  Patient presents with  . Follow-up    HPI Norman Bradley presents for follow-up of his diabetes elevated cholesterol and elevated blood pressure.  Patient has been tolerating the Metformin and from a statin.  No issues with low-dose [place].  He did see the retina specialist.  He is not interested in having a sleep study at this time.  He has been following the keto diet to some extent.  Past Medical History:  Diagnosis Date  . Diabetes mellitus without complication [facility])     Past Surgical History:  Procedure Laterality Date  . STRABISMUS SURGERY      Family History  Problem Relation Age of Onset  . Diabetes Maternal Grandmother   . Diabetes Maternal Grandfather   . Alcohol abuse Mother   . Cancer Mother   . Depression Mother   . Early death Mother   . Cancer Brother     Social History   Socioeconomic History  . Marital status: Single    Spouse name: Not on file  . Number of children: Not on file  . Years of education: Not on file  . Highest education level: Not on file  Occupational History  . Not on file  Social Needs  . Financial resource strain: Not on file  . Food insecurity:    Worry: Not on file    Inability: Not on file  . Transportation needs:    Medical: Not on file    Non-medical: Not on file  Tobacco Use  . Smoking status: Former Research scientist (life sciences)  . Smokeless tobacco: Never Used  Substance and Sexual Activity  . Alcohol use: Yes    Comment: occassionally   . Drug use: No  . Sexual activity: Yes    Partners: Female  Lifestyle  . Physical activity:    Days per week: Not on file    Minutes per session: Not on file  . Stress: Not on file  Relationships  . Social connections:    Talks on phone: Not on file    Gets together: Not on file    Attends religious service: Not on file    Active  member of club or organization: Not on file    Attends meetings of clubs or organizations: Not on file    Relationship status: Not on file  . Intimate partner violence:    Fear of current or ex partner: Not on file    Emotionally abused: Not on file    Physically abused: Not on file    Forced sexual activity: Not on file  Other Topics Concern  . Not on file  Social History Narrative  . Not on file    Outpatient Medications Prior to Visit  Medication Sig Dispense Refill  . glucose blood (ONE TOUCH ULTRA TEST) test strip Use to test blood sugars once daily. 100 each 3  . metFORMIN (GLUCOPHAGE) 1000 MG tablet Take 1 tablet (1,000 mg total) by mouth 2 (two) times daily with a meal. 180 tablet 1  . pravastatin (PRAVACHOL) 20 MG tablet Take 1 tablet (20 mg total) by mouth daily. 90 tablet 3  . trandolapril (MAVIK) 1 MG tablet Take 1 tablet (1 mg total) by mouth daily. 90 tablet 1   No facility-administered medications prior to visit.     No Known Allergies  ROS  Review of Systems  Constitutional: Negative for diaphoresis, fatigue, fever and unexpected weight change.  HENT: Negative.   Eyes: Negative for photophobia and visual disturbance.  Respiratory: Negative.   Gastrointestinal: Negative.   Endocrine: Negative for polyphagia and polyuria.  Genitourinary: Negative.   Musculoskeletal: Negative for gait problem.  Allergic/Immunologic: Negative for immunocompromised state.  Neurological: Negative for seizures, light-headedness and numbness.  Hematological: Does not bruise/bleed easily.  Psychiatric/Behavioral: Negative.       Objective:    Physical Exam  Constitutional: He is oriented to person, place, and time. He appears well-developed and well-nourished. No distress.  HENT:  Head: Normocephalic and atraumatic.  Right Ear: External ear normal.  Left Ear: External ear normal.  Mouth/Throat: Oropharynx is clear and moist. No oropharyngeal exudate.  Eyes: Pupils are equal,  round, and reactive to light. Conjunctivae are normal. Right eye exhibits no discharge. Left eye exhibits no discharge. No scleral icterus.  Neck: Neck supple. No JVD present. No tracheal deviation present. No thyromegaly present.  Cardiovascular: Normal rate, regular rhythm and normal heart sounds.  Pulses:      Dorsalis pedis pulses are 1+ on the right side and 1+ on the left side.       Posterior tibial pulses are 2+ on the right side and 2+ on the left side.  Pulmonary/Chest: Effort normal and breath sounds normal. No stridor.  Abdominal: Bowel sounds are normal.  Lymphadenopathy:    He has no cervical adenopathy.  Neurological: He is alert and oriented to person, place, and time.  Skin: Skin is warm and dry. He is not diaphoretic.  Psychiatric: He has a normal mood and affect. His behavior is normal.  Diabetic Foot Exam - Simple   Simple Foot Form  Visual Inspection  See comments: Yes  Sensation Testing  Intact to touch and monofilament testing bilaterally: Yes  Pulse Check  Posterior Tibialis and Dorsalis pulse intact bilaterally: Yes  Comments  Feet are cavus   BP 126/70   Pulse 82   Ht 5\' 11"  (1.803 m)   Wt 243 lb 8 oz (110.5 kg)   SpO2 96%   BMI 33.96 kg/m  Wt Readings from Last 3 Encounters:  09/06/18 243 lb 8 oz (110.5 kg)  06/07/18 248 lb (112.5 kg)  02/26/18 248 lb 8 oz (112.7 kg)   BP Readings from Last 3 Encounters:  09/06/18 126/70  06/07/18 (!) 142/80  02/26/18 128/82   Guideline developer:  UpToDate (see UpToDate for funding source) Date Released: June 2014  Health Maintenance Due  Topic Date Due  . OPHTHALMOLOGY EXAM  04/08/1961  . FOOT EXAM  04/26/2015  . PNA vac Low Risk Adult (1 of 2 - PCV13) 04/08/2016    There are no preventive care reminders to display for this patient.  Lab Results  Component Value Date   TSH 3.48 05/27/2017   Lab Results  Component Value Date   WBC 6.4 06/07/2018   HGB 15.8 06/07/2018   HCT 46.8 06/07/2018    MCV 89.3 06/07/2018   PLT 160.0 06/07/2018   Lab Results  Component Value Date   NA 139 06/07/2018   K 5.0 06/07/2018   CO2 26 06/07/2018   GLUCOSE 194 (H) 06/07/2018   BUN 19 06/07/2018   CREATININE 1.04 06/07/2018   BILITOT 1.0 06/07/2018   ALKPHOS 68 06/07/2018   AST 30 06/07/2018   ALT 53 06/07/2018   PROT 7.1 06/07/2018   ALBUMIN 4.1 06/07/2018   CALCIUM 9.2 06/07/2018  GFR 75.67 06/07/2018   Lab Results  Component Value Date   CHOL 102 06/07/2018   Lab Results  Component Value Date   HDL 25.40 (L) 06/07/2018   Lab Results  Component Value Date   LDLCALC 35 02/26/2018   Lab Results  Component Value Date   TRIG 210.0 (H) 06/07/2018   Lab Results  Component Value Date   CHOLHDL 4 06/07/2018   Lab Results  Component Value Date   HGBA1C 6.9 (H) 06/07/2018      Assessment & Plan:   Problem List Items Addressed This Visit      Endocrine   Type 2 diabetes mellitus without complication, without long-term current use of insulin (HCC) - Primary   Relevant Medications   metFORMIN (GLUCOPHAGE) 1000 MG tablet   pravastatin (PRAVACHOL) 20 MG tablet   trandolapril (MAVIK) 1 MG tablet   Other Relevant Orders   CBC   Comprehensive metabolic panel   Hemoglobin A1c   Microalbumin / creatinine urine ratio   Urinalysis, Routine w reflex microscopic     Other   Elevated LDL cholesterol level   Relevant Medications   pravastatin (PRAVACHOL) 20 MG tablet   Other Relevant Orders   LDL cholesterol, direct   Elevated BP without diagnosis of hypertension   Relevant Medications   trandolapril (MAVIK) 1 MG tablet   Other Relevant Orders   CBC   Comprehensive metabolic panel   Microalbumin / creatinine urine ratio   Urinalysis, Routine w reflex microscopic   RESOLVED: Healthcare maintenance      Meds ordered this encounter  Medications  . metFORMIN (GLUCOPHAGE) 1000 MG tablet    Sig: Take 1 tablet (1,000 mg total) by mouth 2 (two) times daily with a meal.     Dispense:  180 tablet    Refill:  1  . pravastatin (PRAVACHOL) 20 MG tablet    Sig: Take 1 tablet (20 mg total) by mouth daily.    Dispense:  90 tablet    Refill:  3  . trandolapril (MAVIK) 1 MG tablet    Sig: Take 1 tablet (1 mg total) by mouth daily.    Dispense:  90 tablet    Refill:  1    Follow-up: Return in about 6 months (around 03/09/2019).   Patient encouraged to lose weight and was given information on the Mediterranean diet also suggested weight watchers.  Asked him to reconsider having a sleep study.

## 2018-09-06 NOTE — Patient Instructions (Signed)
Mediterranean Diet  A Mediterranean diet refers to food and lifestyle choices that are based on the traditions of countries located on the [place]. This way of eating has been shown to help prevent certain conditions and improve outcomes for people who have chronic diseases, like kidney disease and heart disease.  What are tips for following this plan?  Lifestyle   Cook and eat meals together with your family, when possible.   Drink enough fluid to keep your urine clear or pale yellow.   Be physically active every day. This includes:  ? Aerobic exercise like running or swimming.  ? Leisure activities like gardening, walking, or housework.   Get 7-8 hours of sleep each night.   If recommended by your health care provider, drink red wine in moderation. This means 1 glass a day for nonpregnant women and 2 glasses a day for men. A glass of wine equals 5 oz (150 mL).  Reading food labels     Check the serving size of packaged foods. For foods such as rice and pasta, the serving size refers to the amount of cooked product, not dry.   Check the total fat in packaged foods. Avoid foods that have saturated fat or trans fats.   Check the ingredients list for added sugars, such as corn syrup.  Shopping   At the grocery store, buy most of your food from the areas near the walls of the store. This includes:  ? Fresh fruits and vegetables (produce).  ? Grains, beans, nuts, and seeds. Some of these may be available in unpackaged forms or large amounts (in bulk).  ? Fresh seafood.  ? Poultry and eggs.  ? Low-fat dairy products.   Buy whole ingredients instead of prepackaged foods.   Buy fresh fruits and vegetables in-season from local farmers markets.   Buy frozen fruits and vegetables in resealable bags.   If you do not have access to quality fresh seafood, buy precooked frozen shrimp or canned fish, such as tuna, salmon, or sardines.   Buy small amounts of raw or cooked vegetables, salads, or olives from  the deli or salad bar at your store.   Stock your pantry so you always have certain foods on hand, such as olive oil, canned tuna, canned tomatoes, rice, pasta, and beans.  Cooking   Cook foods with extra-virgin olive oil instead of using butter or other vegetable oils.   Have meat as a side dish, and have vegetables or grains as your main dish. This means having meat in small portions or adding small amounts of meat to foods like pasta or stew.   Use beans or vegetables instead of meat in common dishes like chili or lasagna.   Experiment with different cooking methods. Try roasting or broiling vegetables instead of steaming or sauteing them.   Add frozen vegetables to soups, stews, pasta, or rice.   Add nuts or seeds for added healthy fat at each meal. You can add these to yogurt, salads, or vegetable dishes.   Marinate fish or vegetables using olive oil, lemon juice, garlic, and fresh herbs.  Meal planning     Plan to eat 1 vegetarian meal one day each week. Try to work up to 2 vegetarian meals, if possible.   Eat seafood 2 or more times a week.   Have healthy snacks readily available, such as:  ? Vegetable sticks with hummus.  ? Greek yogurt.  ? Fruit and nut trail mix.   Eat balanced   meals throughout the week. This includes:  ? Fruit: 2-3 servings a day  ? Vegetables: 4-5 servings a day  ? Low-fat dairy: 2 servings a day  ? Fish, poultry, or lean meat: 1 serving a day  ? Beans and legumes: 2 or more servings a week  ? Nuts and seeds: 1-2 servings a day  ? Whole grains: 6-8 servings a day  ? Extra-virgin olive oil: 3-4 servings a day   Limit red meat and sweets to only a few servings a month  What are my food choices?   Mediterranean diet  ? Recommended  ? Grains: Whole-grain pasta. Brown rice. Bulgar wheat. Polenta. Couscous. Whole-wheat bread. Oatmeal. Quinoa.  ? Vegetables: Artichokes. Beets. Broccoli. Cabbage. Carrots. Eggplant. Green beans. Chard. Kale. Spinach. Onions. Leeks. Peas. Squash.  Tomatoes. Peppers. Radishes.  ? Fruits: Apples. Apricots. Avocado. Berries. Bananas. Cherries. Dates. Figs. Grapes. Lemons. Melon. Oranges. Peaches. Plums. Pomegranate.  ? Meats and other protein foods: Beans. Almonds. Sunflower seeds. Pine nuts. Peanuts. Cod. Salmon. Scallops. Shrimp. Tuna. Tilapia. Clams. Oysters. Eggs.  ? Dairy: Low-fat milk. Cheese. Greek yogurt.  ? Beverages: Water. Red wine. Herbal tea.  ? Fats and oils: Extra virgin olive oil. Avocado oil. Grape seed oil.  ? Sweets and desserts: Greek yogurt with honey. Baked apples. Poached pears. Trail mix.  ? Seasoning and other foods: Basil. Cilantro. Coriander. Cumin. Mint. Parsley. Sage. Rosemary. Tarragon. Garlic. Oregano. Thyme. Pepper. Balsalmic vinegar. Tahini. Hummus. Tomato sauce. Olives. Mushrooms.  ? Limit these  ? Grains: Prepackaged pasta or rice dishes. Prepackaged cereal with added sugar.  ? Vegetables: Deep fried potatoes (french fries).  ? Fruits: Fruit canned in syrup.  ? Meats and other protein foods: Beef. Pork. Lamb. Poultry with skin. Hot dogs. Bacon.  ? Dairy: Ice cream. Sour cream. Whole milk.  ? Beverages: Juice. Sugar-sweetened soft drinks. Beer. Liquor and spirits.  ? Fats and oils: Butter. Canola oil. Vegetable oil. Beef fat (tallow). Lard.  ? Sweets and desserts: Cookies. Cakes. Pies. Candy.  ? Seasoning and other foods: Mayonnaise. Premade sauces and marinades.  ? The items listed may not be a complete list. Talk with your dietitian about what dietary choices are right for you.  Summary   The Mediterranean diet includes both food and lifestyle choices.   Eat a variety of fresh fruits and vegetables, beans, nuts, seeds, and whole grains.   Limit the amount of red meat and sweets that you eat.   Talk with your health care provider about whether it is safe for you to drink red wine in moderation. This means 1 glass a day for nonpregnant women and 2 glasses a day for men. A glass of wine equals 5 oz (150 mL).  This information  is not intended to replace advice given to you by your health care provider. Make sure you discuss any questions you have with your health care provider.  Document Released: 02/07/2016 Document Revised: 03/11/2016 Document Reviewed: 02/07/2016  Elsevier Interactive Patient Education  2019 [employer] Inc[employer]

## 2018-11-11 ENCOUNTER — Encounter (INDEPENDENT_AMBULATORY_CARE_PROVIDER_SITE_OTHER): Payer: Medicare Other | Admitting: Ophthalmology

## 2018-11-16 ENCOUNTER — Encounter: Payer: Self-pay | Admitting: Family Medicine

## 2018-11-25 ENCOUNTER — Other Ambulatory Visit: Payer: Self-pay | Admitting: Family Medicine

## 2018-11-25 DIAGNOSIS — R030 Elevated blood-pressure reading, without diagnosis of hypertension: Secondary | ICD-10-CM

## 2018-11-25 DIAGNOSIS — E119 Type 2 diabetes mellitus without complications: Secondary | ICD-10-CM

## 2019-03-14 ENCOUNTER — Ambulatory Visit: Payer: Medicare Other | Admitting: Family Medicine

## 2019-03-22 ENCOUNTER — Other Ambulatory Visit: Payer: Self-pay

## 2019-03-22 ENCOUNTER — Encounter: Payer: Self-pay | Admitting: Family Medicine

## 2019-03-22 ENCOUNTER — Ambulatory Visit (INDEPENDENT_AMBULATORY_CARE_PROVIDER_SITE_OTHER): Payer: Medicare Other | Admitting: Family Medicine

## 2019-03-22 VITALS — BP 120/80 | HR 83 | Ht 71.0 in | Wt 249.0 lb

## 2019-03-22 DIAGNOSIS — J3489 Other specified disorders of nose and nasal sinuses: Secondary | ICD-10-CM | POA: Diagnosis not present

## 2019-03-22 DIAGNOSIS — Z23 Encounter for immunization: Secondary | ICD-10-CM

## 2019-03-22 NOTE — Progress Notes (Signed)
Established Patient Office Visit  Subjective:  Patient ID: Norman Bradley, male    DOB: 1951-04-12  Age: 68 y.o. MRN: [id number]  CC:  Chief Complaint  Patient presents with  . Ear Pain  . Sinus Problem    HPI Norman Bradley presents for follow-up of left upper gum tenderness the last 7 days.  He saw his dentist who prescribed amoxicillin 500 mg 3 times daily for 10 days.  Patient was diagnosed with a potentially cracked nerve root.  He has gone on to develop some pressure in his cheek bones.  He feels well.  There is been no fevers chills, rhinorrhea or postnasal drip.  He does not suffer from fall allergies.  Denies sneezing scratchy throat or itchy eyes.  He has been tolerating the amoxicillin has 3 more days.  Past Medical History:  Diagnosis Date  . Diabetes mellitus without complication [facility])     Past Surgical History:  Procedure Laterality Date  . STRABISMUS SURGERY      Family History  Problem Relation Age of Onset  . Diabetes Maternal Grandmother   . Diabetes Maternal Grandfather   . Alcohol abuse Mother   . Cancer Mother   . Depression Mother   . Early death Mother   . Cancer Brother     Social History   Socioeconomic History  . Marital status: Single    Spouse name: Not on file  . Number of children: Not on file  . Years of education: Not on file  . Highest education level: Not on file  Occupational History  . Not on file  Social Needs  . Financial resource strain: Not on file  . Food insecurity    Worry: Not on file    Inability: Not on file  . Transportation needs    Medical: Not on file    Non-medical: Not on file  Tobacco Use  . Smoking status: Former Research scientist (life sciences)  . Smokeless tobacco: Never Used  Substance and Sexual Activity  . Alcohol use: Yes    Comment: occassionally   . Drug use: No  . Sexual activity: Yes    Partners: Female  Lifestyle  . Physical activity    Days per week: Not on file    Minutes per session: Not on file   . Stress: Not on file  Relationships  . Social Herbalist on phone: Not on file    Gets together: Not on file    Attends religious service: Not on file    Active member of club or organization: Not on file    Attends meetings of clubs or organizations: Not on file    Relationship status: Not on file  . Intimate partner violence    Fear of current or ex partner: Not on file    Emotionally abused: Not on file    Physically abused: Not on file    Forced sexual activity: Not on file  Other Topics Concern  . Not on file  Social History Narrative  . Not on file    Outpatient Medications Prior to Visit  Medication Sig Dispense Refill  . amoxicillin (AMOXIL) 500 MG tablet Take 1 tablet by mouth 3 (three) times daily.    Marland Kitchen glucose blood (ONE TOUCH ULTRA TEST) test strip Use to test blood sugars once daily. 100 each 3  . metFORMIN (GLUCOPHAGE) 1000 MG tablet Take 1 tablet (1,000 mg total) by mouth 2 (two) times daily with a meal. 180 tablet  1  . pravastatin (PRAVACHOL) 20 MG tablet Take 1 tablet (20 mg total) by mouth daily. 90 tablet 3  . trandolapril (MAVIK) 1 MG tablet TAKE 1 TABLET(1 MG) BY MOUTH DAILY 90 tablet 1   No facility-administered medications prior to visit.     No Known Allergies  ROS Review of Systems  Constitutional: Negative for chills, diaphoresis, fatigue, fever and unexpected weight change.  HENT: Positive for sinus pressure. Negative for congestion, nosebleeds, postnasal drip, rhinorrhea, sinus pain, sneezing and sore throat.   Eyes: Negative for photophobia and visual disturbance.  Respiratory: Negative for cough and shortness of breath.   Cardiovascular: Negative.   Gastrointestinal: Negative.   Musculoskeletal: Negative for arthralgias and myalgias.  Allergic/Immunologic: Negative for immunocompromised state.  Neurological: Negative for light-headedness and headaches.  Hematological: Does not bruise/bleed easily.  Psychiatric/Behavioral:  Negative.       Objective:    Physical Exam  Constitutional: He is oriented to person, place, and time. He appears well-developed and well-nourished. No distress.  HENT:  Head: Normocephalic and atraumatic.  Right Ear: External ear normal.  Left Ear: External ear normal.  Mouth/Throat: Oropharynx is clear and moist. No oropharyngeal exudate.  Eyes: Pupils are equal, round, and reactive to light. Conjunctivae are normal. Right eye exhibits no discharge. Left eye exhibits no discharge. No scleral icterus.  Neck: Neck supple. No JVD present. No tracheal deviation present. No thyromegaly present.  Cardiovascular: Normal rate, regular rhythm and normal heart sounds.  Pulmonary/Chest: Effort normal and breath sounds normal. No stridor.  Lymphadenopathy:    He has no cervical adenopathy.  Neurological: He is alert and oriented to person, place, and time.  Skin: Skin is warm and dry. He is not diaphoretic.  Psychiatric: He has a normal mood and affect. His behavior is normal.    BP 120/80   Pulse 83   Ht 5\' 11"  (1.803 m)   Wt 249 lb (112.9 kg)   SpO2 97%   BMI 34.73 kg/m  Wt Readings from Last 3 Encounters:  03/22/19 249 lb (112.9 kg)  09/06/18 243 lb 8 oz (110.5 kg)  06/07/18 248 lb (112.5 kg)   BP Readings from Last 3 Encounters:  03/22/19 120/80  09/06/18 126/70  06/07/18 (!) 142/80   Guideline developer:  UpToDate (see UpToDate for funding source) Date Released: June 2014  Health Maintenance Due  Topic Date Due  . FOOT EXAM  04/26/2015  . PNA vac Low Risk Adult (1 of 2 - PCV13) 04/08/2016  . INFLUENZA VACCINE  01/29/2019  . HEMOGLOBIN A1C  03/09/2019    There are no preventive care reminders to display for this patient.  Lab Results  Component Value Date   TSH 3.48 05/27/2017   Lab Results  Component Value Date   WBC 6.4 09/06/2018   HGB 16.0 09/06/2018   HCT 47.2 09/06/2018   MCV 90.0 09/06/2018   PLT 146.0 (L) 09/06/2018   Lab Results  Component Value  Date   NA 137 09/06/2018   K 4.6 09/06/2018   CO2 27 09/06/2018   GLUCOSE 150 (H) 09/06/2018   BUN 20 09/06/2018   CREATININE 1.12 09/06/2018   BILITOT 1.2 09/06/2018   ALKPHOS 57 09/06/2018   AST 30 09/06/2018   ALT 55 (H) 09/06/2018   PROT 7.3 09/06/2018   ALBUMIN 4.3 09/06/2018   CALCIUM 9.4 09/06/2018   GFR 65.31 09/06/2018   Lab Results  Component Value Date   CHOL 102 06/07/2018   Lab Results  Component  Value Date   HDL 25.40 (L) 06/07/2018   Lab Results  Component Value Date   LDLCALC 35 02/26/2018   Lab Results  Component Value Date   TRIG 210.0 (H) 06/07/2018   Lab Results  Component Value Date   CHOLHDL 4 06/07/2018   Lab Results  Component Value Date   HGBA1C 6.3 09/06/2018      Assessment & Plan:   Problem List Items Addressed This Visit      Other   Sinus pressure - Primary      No orders of the defined types were placed in this encounter.   Follow-up: Return if symptoms worsen or fail to improve, for start flonase 2 sprays in each nostril daily. .   Patient will finish amoxicillin and start Flonase 2 sprays in each nostril daily.  Follow-up will be PRN

## 2019-04-08 ENCOUNTER — Encounter: Payer: Self-pay | Admitting: Family Medicine

## 2019-04-08 ENCOUNTER — Ambulatory Visit (INDEPENDENT_AMBULATORY_CARE_PROVIDER_SITE_OTHER): Payer: Medicare Other | Admitting: Family Medicine

## 2019-04-08 ENCOUNTER — Other Ambulatory Visit: Payer: Self-pay

## 2019-04-08 VITALS — BP 130/70 | HR 72 | Ht 71.0 in | Wt 248.0 lb

## 2019-04-08 DIAGNOSIS — H6982 Other specified disorders of Eustachian tube, left ear: Secondary | ICD-10-CM | POA: Diagnosis not present

## 2019-04-08 DIAGNOSIS — M19121 Post-traumatic osteoarthritis, right elbow: Secondary | ICD-10-CM | POA: Diagnosis not present

## 2019-04-08 MED ORDER — PREDNISONE 10 MG (21) PO TBPK: ORAL_TABLET | ORAL | 0 refills | Status: DC

## 2019-04-08 NOTE — Progress Notes (Signed)
Established Patient Office Visit  Subjective:  Patient ID: Norman Bradley, male    DOB: 16-Oct-1950  Age: 68 y.o. MRN: [id number]  CC:  Chief Complaint  Patient presents with  . discomfort in left ear    HPI Norman Bradley presents for follow-up of his sinus pain and right ear congestion.  He has been using the Flonase daily as directed.  He woke up this morning with acute left ear congestion.  There is been no fever, rhinorrhea or postnasal drip.  There was a question of a tooth with a cracked root.  He is having no issue with pressure sensitivity or hot and cold sensitivity.  He has an appointment with endodontist first of the week.  He did complete his course of antibiotic.  He does have a history of scalp dermatitis that also has affected his ear canals from time to time.  History of severe arthritis affecting his right elbow with subsequent limited flexion from an injury he sustained in the [employer].  Past Medical History:  Diagnosis Date  . Diabetes mellitus without complication [facility])     Past Surgical History:  Procedure Laterality Date  . STRABISMUS SURGERY      Family History  Problem Relation Age of Onset  . Diabetes Maternal Grandmother   . Diabetes Maternal Grandfather   . Alcohol abuse Mother   . Cancer Mother   . Depression Mother   . Early death Mother   . Cancer Brother     Social History   Socioeconomic History  . Marital status: Single    Spouse name: Not on file  . Number of children: Not on file  . Years of education: Not on file  . Highest education level: Not on file  Occupational History  . Not on file  Social Needs  . Financial resource strain: Not on file  . Food insecurity    Worry: Not on file    Inability: Not on file  . Transportation needs    Medical: Not on file    Non-medical: Not on file  Tobacco Use  . Smoking status: Former Research scientist (life sciences)  . Smokeless tobacco: Never Used  Substance and Sexual Activity  . Alcohol use: Yes     Comment: occassionally   . Drug use: No  . Sexual activity: Yes    Partners: Female  Lifestyle  . Physical activity    Days per week: Not on file    Minutes per session: Not on file  . Stress: Not on file  Relationships  . Social Herbalist on phone: Not on file    Gets together: Not on file    Attends religious service: Not on file    Active member of club or organization: Not on file    Attends meetings of clubs or organizations: Not on file    Relationship status: Not on file  . Intimate partner violence    Fear of current or ex partner: Not on file    Emotionally abused: Not on file    Physically abused: Not on file    Forced sexual activity: Not on file  Other Topics Concern  . Not on file  Social History Narrative  . Not on file    Outpatient Medications Prior to Visit  Medication Sig Dispense Refill  . glucose blood (ONE TOUCH ULTRA TEST) test strip Use to test blood sugars once daily. 100 each 3  . metFORMIN (GLUCOPHAGE) 1000 MG tablet Take 1  tablet (1,000 mg total) by mouth 2 (two) times daily with a meal. 180 tablet 1  . pravastatin (PRAVACHOL) 20 MG tablet Take 1 tablet (20 mg total) by mouth daily. 90 tablet 3  . trandolapril (MAVIK) 1 MG tablet TAKE 1 TABLET(1 MG) BY MOUTH DAILY 90 tablet 1  . amoxicillin (AMOXIL) 500 MG tablet Take 1 tablet by mouth 3 (three) times daily.     No facility-administered medications prior to visit.     No Known Allergies  ROS Review of Systems  Constitutional: Negative for chills, diaphoresis, fatigue, fever and unexpected weight change.  HENT: Positive for dental problem, ear pain, hearing loss and sinus pain. Negative for congestion, facial swelling, postnasal drip, rhinorrhea, trouble swallowing and voice change.   Eyes: Negative for photophobia and visual disturbance.  Respiratory: Negative for cough, shortness of breath and wheezing.   Cardiovascular: Negative.   Gastrointestinal: Negative.   Endocrine:  Negative for polyphagia and polyuria.  Genitourinary: Negative.   Musculoskeletal: Positive for arthralgias.  Skin: Negative for pallor and rash.  Allergic/Immunologic: Negative for immunocompromised state.  Neurological: Negative.   Hematological: Does not bruise/bleed easily.  Psychiatric/Behavioral: Negative.       Objective:    Physical Exam  Constitutional: He is oriented to person, place, and time. He appears well-developed and well-nourished. No distress.  HENT:  Head: Normocephalic and atraumatic.  Right Ear: Tympanic membrane, external ear and ear canal normal.  Left Ear: External ear normal. Tympanic membrane is retracted. Tympanic membrane is not injected, not perforated and not erythematous.  No middle ear effusion.  Ears:  Mouth/Throat: Oropharynx is clear and moist. No oropharyngeal exudate.  Eyes: Conjunctivae are normal. Right eye exhibits no discharge. Left eye exhibits no discharge. No scleral icterus.  Neck: No JVD present. No tracheal deviation present.  Pulmonary/Chest: Effort normal. No stridor.  Abdominal: Bowel sounds are normal.  Musculoskeletal:     Right elbow: He exhibits decreased range of motion, swelling and deformity. He exhibits no effusion.  Neurological: He is oriented to person, place, and time.  Skin: Skin is warm and dry. He is not diaphoretic.  Psychiatric: He has a normal mood and affect. His behavior is normal.    BP 130/70   Pulse 72   Ht 5\' 11"  (1.803 m)   Wt 248 lb (112.5 kg)   SpO2 99%   BMI 34.59 kg/m  Wt Readings from Last 3 Encounters:  04/08/19 248 lb (112.5 kg)  03/22/19 249 lb (112.9 kg)  09/06/18 243 lb 8 oz (110.5 kg)   BP Readings from Last 3 Encounters:  04/08/19 130/70  03/22/19 120/80  09/06/18 126/70   Guideline developer:  UpToDate (see UpToDate for funding source) Date Released: June 2014  Health Maintenance Due  Topic Date Due  . FOOT EXAM  04/26/2015  . PNA vac Low Risk Adult (1 of 2 - PCV13)  04/08/2016  . HEMOGLOBIN A1C  03/09/2019    There are no preventive care reminders to display for this patient.  Lab Results  Component Value Date   TSH 3.48 05/27/2017   Lab Results  Component Value Date   WBC 6.4 09/06/2018   HGB 16.0 09/06/2018   HCT 47.2 09/06/2018   MCV 90.0 09/06/2018   PLT 146.0 (L) 09/06/2018   Lab Results  Component Value Date   NA 137 09/06/2018   K 4.6 09/06/2018   CO2 27 09/06/2018   GLUCOSE 150 (H) 09/06/2018   BUN 20 09/06/2018   CREATININE 1.12  09/06/2018   BILITOT 1.2 09/06/2018   ALKPHOS 57 09/06/2018   AST 30 09/06/2018   ALT 55 (H) 09/06/2018   PROT 7.3 09/06/2018   ALBUMIN 4.3 09/06/2018   CALCIUM 9.4 09/06/2018   GFR 65.31 09/06/2018   Lab Results  Component Value Date   CHOL 102 06/07/2018   Lab Results  Component Value Date   HDL 25.40 (L) 06/07/2018   Lab Results  Component Value Date   LDLCALC 35 02/26/2018   Lab Results  Component Value Date   TRIG 210.0 (H) 06/07/2018   Lab Results  Component Value Date   CHOLHDL 4 06/07/2018   Lab Results  Component Value Date   HGBA1C 6.3 09/06/2018      Assessment & Plan:   Problem List Items Addressed This Visit      Nervous and Auditory   Dysfunction of left eustachian tube - Primary   Relevant Medications   predniSONE (STERAPRED UNI-PAK 21 TAB) 10 MG (21) TBPK tablet     Musculoskeletal and Integument   Post-traumatic osteoarthritis of right elbow   Relevant Medications   predniSONE (STERAPRED UNI-PAK 21 TAB) 10 MG (21) TBPK tablet      Meds ordered this encounter  Medications  . predniSONE (STERAPRED UNI-PAK 21 TAB) 10 MG (21) TBPK tablet    Sig: Take 6 today, 5 tomorrow, 4 the next day and then 3, 2, 1 and stop    Dispense:  21 tablet    Refill:  0    Follow-up: Return will let me know about referral..   We will continue Flonase.  We will start a 6-day prednisone taper tomorrow.  Will let me know first of the week if not improving or ENT  consultation.

## 2019-06-15 ENCOUNTER — Other Ambulatory Visit: Payer: Self-pay | Admitting: Family Medicine

## 2019-06-15 DIAGNOSIS — R030 Elevated blood-pressure reading, without diagnosis of hypertension: Secondary | ICD-10-CM

## 2019-06-15 DIAGNOSIS — E119 Type 2 diabetes mellitus without complications: Secondary | ICD-10-CM

## 2019-07-26 ENCOUNTER — Ambulatory Visit: Payer: Medicare Other

## 2019-08-04 ENCOUNTER — Ambulatory Visit: Payer: Medicare Other

## 2019-08-25 ENCOUNTER — Ambulatory Visit: Payer: Medicare Other

## 2019-09-12 DIAGNOSIS — L720 Epidermal cyst: Secondary | ICD-10-CM | POA: Diagnosis not present

## 2019-09-12 DIAGNOSIS — L812 Freckles: Secondary | ICD-10-CM | POA: Diagnosis not present

## 2019-09-12 DIAGNOSIS — D225 Melanocytic nevi of trunk: Secondary | ICD-10-CM | POA: Diagnosis not present

## 2019-09-12 DIAGNOSIS — L218 Other seborrheic dermatitis: Secondary | ICD-10-CM | POA: Diagnosis not present

## 2019-09-12 DIAGNOSIS — L821 Other seborrheic keratosis: Secondary | ICD-10-CM | POA: Diagnosis not present

## 2019-09-21 ENCOUNTER — Other Ambulatory Visit: Payer: Self-pay | Admitting: Family Medicine

## 2019-09-21 DIAGNOSIS — E119 Type 2 diabetes mellitus without complications: Secondary | ICD-10-CM

## 2019-09-21 MED ORDER — METFORMIN HCL 1000 MG PO TABS: ORAL_TABLET | ORAL | 0 refills | Status: DC

## 2019-09-21 NOTE — Telephone Encounter (Signed)
Called pt to schedule follow up appointment no answer LMTCB

## 2019-09-21 NOTE — Telephone Encounter (Signed)
Needs appointment

## 2019-09-21 NOTE — Addendum Note (Signed)
Addended by: Abelino Derrick A on: 09/21/2019 11:10 AM   Modules accepted: Orders

## 2019-10-02 ENCOUNTER — Encounter: Payer: Self-pay | Admitting: Family Medicine

## 2019-10-03 ENCOUNTER — Ambulatory Visit: Payer: Medicare Other | Admitting: Family Medicine

## 2019-10-03 NOTE — Telephone Encounter (Signed)
Patient overdue for follow up

## 2019-10-17 ENCOUNTER — Ambulatory Visit (INDEPENDENT_AMBULATORY_CARE_PROVIDER_SITE_OTHER): Payer: Medicare Other | Admitting: Family Medicine

## 2019-10-17 ENCOUNTER — Other Ambulatory Visit: Payer: Self-pay

## 2019-10-17 ENCOUNTER — Encounter: Payer: Self-pay | Admitting: Family Medicine

## 2019-10-17 ENCOUNTER — Encounter: Payer: Self-pay | Admitting: Neurology

## 2019-10-17 VITALS — BP 120/82 | HR 75 | Temp 97.0°F | Ht 71.0 in | Wt 245.0 lb

## 2019-10-17 DIAGNOSIS — R251 Tremor, unspecified: Secondary | ICD-10-CM | POA: Diagnosis not present

## 2019-10-17 DIAGNOSIS — E559 Vitamin D deficiency, unspecified: Secondary | ICD-10-CM | POA: Diagnosis not present

## 2019-10-17 DIAGNOSIS — E7800 Pure hypercholesterolemia, unspecified: Secondary | ICD-10-CM

## 2019-10-17 DIAGNOSIS — Z0000 Encounter for general adult medical examination without abnormal findings: Secondary | ICD-10-CM

## 2019-10-17 DIAGNOSIS — E119 Type 2 diabetes mellitus without complications: Secondary | ICD-10-CM

## 2019-10-17 DIAGNOSIS — I10 Essential (primary) hypertension: Secondary | ICD-10-CM | POA: Diagnosis not present

## 2019-10-17 DIAGNOSIS — Z125 Encounter for screening for malignant neoplasm of prostate: Secondary | ICD-10-CM

## 2019-10-17 LAB — URINALYSIS, ROUTINE W REFLEX MICROSCOPIC
Bilirubin Urine: NEGATIVE
Hgb urine dipstick: NEGATIVE
Ketones, ur: NEGATIVE
Leukocytes,Ua: NEGATIVE
Nitrite: NEGATIVE
RBC / HPF: NONE SEEN (ref 0–?)
Specific Gravity, Urine: 1.02 (ref 1.000–1.030)
Total Protein, Urine: NEGATIVE
Urine Glucose: NEGATIVE
Urobilinogen, UA: 2 — AB (ref 0.0–1.0)
WBC, UA: NONE SEEN (ref 0–?)
pH: 6.5 (ref 5.0–8.0)

## 2019-10-17 LAB — COMPREHENSIVE METABOLIC PANEL
ALT: 67 U/L — ABNORMAL HIGH (ref 0–53)
AST: 53 U/L — ABNORMAL HIGH (ref 0–37)
Albumin: 4.2 g/dL (ref 3.5–5.2)
Alkaline Phosphatase: 56 U/L (ref 39–117)
BUN: 16 mg/dL (ref 6–23)
CO2: 28 mEq/L (ref 19–32)
Calcium: 8.9 mg/dL (ref 8.4–10.5)
Chloride: 98 mEq/L (ref 96–112)
Creatinine, Ser: 0.99 mg/dL (ref 0.40–1.50)
GFR: 75.06 mL/min (ref >60.00)
Glucose, Bld: 179 mg/dL — ABNORMAL HIGH (ref 70–99)
Potassium: 4.2 mEq/L (ref 3.5–5.1)
Sodium: 134 mEq/L — ABNORMAL LOW (ref 135–145)
Total Bilirubin: 1.6 mg/dL — ABNORMAL HIGH (ref 0.2–1.2)
Total Protein: 6.8 g/dL (ref 6.0–8.3)

## 2019-10-17 LAB — LIPID PANEL
Cholesterol: 121 mg/dL (ref 0–200)
HDL: 28.4 mg/dL — ABNORMAL LOW (ref >39.00)
LDL Cholesterol: 56 mg/dL (ref 0–99)
NonHDL: 93
Total CHOL/HDL Ratio: 4
Triglycerides: 185 mg/dL — ABNORMAL HIGH (ref 0.0–149.0)
VLDL: 37 mg/dL (ref 0.0–40.0)

## 2019-10-17 LAB — CBC
HCT: 43.9 % (ref 39.0–52.0)
Hemoglobin: 15.1 g/dL (ref 13.0–17.0)
MCHC: 34.4 g/dL (ref 30.0–36.0)
MCV: 89.1 fl (ref 78.0–100.0)
Platelets: 141 10*3/uL — ABNORMAL LOW (ref 150.0–400.0)
RBC: 4.93 Mil/uL (ref 4.22–5.81)
RDW: 13.8 % (ref 11.5–15.5)
WBC: 4.8 10*3/uL (ref 4.0–10.5)

## 2019-10-17 LAB — PSA: PSA: 1.02 ng/mL (ref 0.10–4.00)

## 2019-10-17 LAB — VITAMIN D 25 HYDROXY (VIT D DEFICIENCY, FRACTURES): VITD: 83.14 ng/mL (ref 30.00–100.00)

## 2019-10-17 LAB — HEMOGLOBIN A1C: Hgb A1c MFr Bld: 6.9 % — ABNORMAL HIGH (ref 4.6–6.5)

## 2019-10-17 LAB — LDL CHOLESTEROL, DIRECT: Direct LDL: 66 mg/dL

## 2019-10-17 NOTE — Progress Notes (Addendum)
Established Patient Office Visit  Subjective:  Patient ID: Norman Bradley, male    DOB: 10/22/1950  Age: 69 y.o. MRN: [id number]  CC:  Chief Complaint  Patient presents with  . Annual Exam    HPI Norman Bradley presents for follow-up of his hypertension, elevated cholesterol and diabetes.  As far as he knows these have been under good control with his current medication schedule.  Has been lost to follow-up for over a year now.  Will be seeing the eye doctor next week.  They go more than a appointment at home.  Has not been to see the dentist over this past year.  He has had his Covid vaccine.  He has had a tremor of his left hand.  It is more noticeable when he is smoking a cigar or goes but goes to poor glass of wine.  No family history of Parkinson's disease.  He is also having is some problems with his left shoulder but is scheduled to look an appointment with his orthopedist.  Urine flow has been excellent.  There are no issues there.  Past Medical History:  Diagnosis Date  . Diabetes mellitus without complication [facility])     Past Surgical History:  Procedure Laterality Date  . STRABISMUS SURGERY      Family History  Problem Relation Age of Onset  . Diabetes Maternal Grandmother   . Diabetes Maternal Grandfather   . Alcohol abuse Mother   . Cancer Mother   . Depression Mother   . Early death Mother   . Cancer Brother     Social History   Socioeconomic History  . Marital status: Single    Spouse name: Not on file  . Number of children: Not on file  . Years of education: Not on file  . Highest education level: Not on file  Occupational History  . Not on file  Tobacco Use  . Smoking status: Former Research scientist (life sciences)  . Smokeless tobacco: Never Used  Substance and Sexual Activity  . Alcohol use: Yes    Comment: occassionally   . Drug use: No  . Sexual activity: Yes    Partners: Female  Other Topics Concern  . Not on file  Social History Narrative  . Not on  file   Social Determinants of Health   Financial Resource Strain:   . Difficulty of Paying Living Expenses:   Food Insecurity:   . Worried About Charity fundraiser in the Last Year:   . Arboriculturist in the Last Year:   Transportation Needs:   . Film/video editor (Medical):   Marland Kitchen Lack of Transportation (Non-Medical):   Physical Activity:   . Days of Exercise per Week:   . Minutes of Exercise per Session:   Stress:   . Feeling of Stress :   Social Connections:   . Frequency of Communication with Friends and Family:   . Frequency of Social Gatherings with Friends and Family:   . Attends Religious Services:   . Active Member of Clubs or Organizations:   . Attends Archivist Meetings:   Marland Kitchen Marital Status:   Intimate Partner Violence:   . Fear of Current or Ex-Partner:   . Emotionally Abused:   Marland Kitchen Physically Abused:   . Sexually Abused:     Outpatient Medications Prior to Visit  Medication Sig Dispense Refill  . glucose blood (ONE TOUCH ULTRA TEST) test strip Use to test blood sugars once daily. 100 each  3  . metFORMIN (GLUCOPHAGE) 1000 MG tablet TAKE 1 TABLET(1000 MG) BY MOUTH TWICE DAILY WITH A MEAL 180 tablet 0  . pravastatin (PRAVACHOL) 20 MG tablet Take 1 tablet (20 mg total) by mouth daily. 90 tablet 3  . trandolapril (MAVIK) 1 MG tablet TAKE 1 TABLET(1 MG) BY MOUTH DAILY 90 tablet 1  . predniSONE (STERAPRED UNI-PAK 21 TAB) 10 MG (21) TBPK tablet Take 6 today, 5 tomorrow, 4 the next day and then 3, 2, 1 and stop 21 tablet 0   No facility-administered medications prior to visit.    No Known Allergies  ROS Review of Systems  Constitutional: Negative.   HENT: Negative.   Eyes: Negative for photophobia and visual disturbance.  Respiratory: Negative.   Cardiovascular: Negative.   Gastrointestinal: Negative.   Endocrine: Negative for polyphagia and polyuria.  Genitourinary: Negative for difficulty urinating, frequency and urgency.  Musculoskeletal:  Positive for arthralgias. Negative for gait problem.  Skin: Negative for pallor.  Neurological: Positive for tremors. Negative for speech difficulty, weakness and numbness.  Hematological: Does not bruise/bleed easily.  Psychiatric/Behavioral: Negative.       Objective:    Physical Exam  Constitutional: He is oriented to person, place, and time. He appears well-developed and well-nourished. No distress.  HENT:  Head: Normocephalic and atraumatic.  Right Ear: External ear normal.  Left Ear: External ear normal.  Eyes: Pupils are equal, round, and reactive to light. Conjunctivae are normal. Right eye exhibits no discharge. Left eye exhibits no discharge. No scleral icterus.  Neck: No JVD present. No tracheal deviation present. No thyromegaly present.  Cardiovascular: Normal rate, regular rhythm and normal heart sounds.  Pulses:      Dorsalis pedis pulses are 1+ on the right side and 1+ on the left side.       Posterior tibial pulses are 1+ on the right side and 1+ on the left side.  Pulmonary/Chest: Effort normal and breath sounds normal. No stridor.  Abdominal: Bowel sounds are normal. He exhibits no distension. There is no abdominal tenderness. There is no rebound and no guarding.  Musculoskeletal:        General: No edema.  Lymphadenopathy:    He has no cervical adenopathy.  Neurological: He is alert and oriented to person, place, and time. Coordination and gait normal.  Resting tremor of left hand.  Neg ftn testing.   Skin: Skin is warm and dry. He is not diaphoretic.  Psychiatric: He has a normal mood and affect. His behavior is normal.    BP 120/82 (BP Location: Left Arm, Patient Position: Sitting, Cuff Size: Large)   Pulse 75   Temp (!) 97 F (36.1 C) (Temporal)   Ht 5\' 11"  (1.803 m)   Wt 245 lb (111.1 kg)   SpO2 96%   BMI 34.17 kg/m  Wt Readings from Last 3 Encounters:  10/17/19 245 lb (111.1 kg)  04/08/19 248 lb (112.5 kg)  03/22/19 249 lb (112.9 kg)      Health Maintenance Due  Topic Date Due  . TETANUS/TDAP  Never done  . OPHTHALMOLOGY EXAM  07/10/2019    There are no preventive care reminders to display for this patient.  Lab Results  Component Value Date   TSH 3.48 05/27/2017   Lab Results  Component Value Date   WBC 4.8 10/17/2019   HGB 15.1 10/17/2019   HCT 43.9 10/17/2019   MCV 89.1 10/17/2019   PLT 141.0 (L) 10/17/2019   Lab Results  Component Value Date  NA 134 (L) 10/17/2019   K 4.2 10/17/2019   CO2 28 10/17/2019   GLUCOSE 179 (H) 10/17/2019   BUN 16 10/17/2019   CREATININE 0.99 10/17/2019   BILITOT 1.6 (H) 10/17/2019   ALKPHOS 56 10/17/2019   AST 53 (H) 10/17/2019   ALT 67 (H) 10/17/2019   PROT 6.8 10/17/2019   ALBUMIN 4.2 10/17/2019   CALCIUM 8.9 10/17/2019   GFR 75.06 10/17/2019   Lab Results  Component Value Date   CHOL 121 10/17/2019   Lab Results  Component Value Date   HDL 28.40 (L) 10/17/2019   Lab Results  Component Value Date   LDLCALC 56 10/17/2019   Lab Results  Component Value Date   TRIG 185.0 (H) 10/17/2019   Lab Results  Component Value Date   CHOLHDL 4 10/17/2019   Lab Results  Component Value Date   HGBA1C 6.9 (H) 10/17/2019      Assessment & Plan:   Problem List Items Addressed This Visit      Cardiovascular and Mediastinum   Essential hypertension - Primary   Relevant Orders   CBC (Completed)   Comprehensive metabolic panel (Completed)   Urinalysis, Routine w reflex microscopic (Completed)     Endocrine   Type 2 diabetes mellitus without complication, without long-term current use of insulin (HCC)   Relevant Orders   Comprehensive metabolic panel (Completed)   Hemoglobin A1c (Completed)   Urinalysis, Routine w reflex microscopic (Completed)     Other   Elevated LDL cholesterol level   Relevant Orders   LDL cholesterol, direct (Completed)   Lipid panel (Completed)   Tremor of left hand   Relevant Orders   Ambulatory referral to Neurology    Vitamin D deficiency   Relevant Orders   VITAMIN D 25 Hydroxy (Vit-D Deficiency, Fractures) (Completed)    Other Visit Diagnoses    Healthcare maintenance       Relevant Orders   PSA (Completed)      No orders of the defined types were placed in this encounter.   Follow-up: Return in about 3 months (around 01/16/2020).  Unilateral resting tremor.   Libby Maw, MD

## 2019-10-25 ENCOUNTER — Telehealth: Payer: Self-pay | Admitting: Family Medicine

## 2019-10-25 DIAGNOSIS — I10 Essential (primary) hypertension: Secondary | ICD-10-CM

## 2019-10-25 DIAGNOSIS — E7800 Pure hypercholesterolemia, unspecified: Secondary | ICD-10-CM

## 2019-10-25 DIAGNOSIS — E559 Vitamin D deficiency, unspecified: Secondary | ICD-10-CM

## 2019-10-25 NOTE — Progress Notes (Signed)
  Chronic Care Management   Outreach Note  10/25/2019 Name: Norman Bradley MRN: [id number] DOB: 1951/04/16  Referred by: Libby Maw, MD Reason for referral : No chief complaint on file.   An unsuccessful telephone outreach was attempted today. The patient was referred to the pharmacist for assistance with care management and care coordination.   This note is not being shared with the patient for the following reason: To respect privacy (The patient or proxy has requested that the information not be shared).  Follow Up Plan:   SIGNATURE

## 2019-10-25 NOTE — Progress Notes (Signed)
  Chronic Care Management   Note  10/25/2019 Name: JOHN PAGE MRN: [id number] DOB: [birth year]  VADIN SHELLY is a 69 y.o. year old male who is a primary care patient of Libby Maw, MD. I reached out to Ralene Ok Mellette by phone today in response to a referral sent by Mr. Keala Gormley Roker's PCP, Libby Maw, MD.   Mr. Vanvalkenburgh was given information about Chronic Care Management services today including:  1. CCM service includes personalized support from designated clinical staff supervised by his physician, including individualized plan of care and coordination with other care providers 2. 24/7 contact phone numbers for assistance for urgent and routine care needs. 3. Service will only be billed when office clinical staff spend 20 minutes or more in a month to coordinate care. 4. Only one practitioner may furnish and bill the service in a calendar month. 5. The patient may stop CCM services at any time (effective at the end of the month) by phone call to the office staff.   Patient agreed to services and verbal consent obtained.   This note is not being shared with the patient for the following reason: To respect privacy (The patient or proxy has requested that the information not be shared).  Follow up plan:   [name] Upstream Scheduler

## 2019-10-27 NOTE — Addendum Note (Signed)
Addended by: Lynda Rainwater on: 10/27/2019 04:35 PM   Modules accepted: Orders

## 2019-11-01 ENCOUNTER — Telehealth: Payer: Self-pay | Admitting: Family Medicine

## 2019-11-01 NOTE — Progress Notes (Signed)
  Chronic Care Management   Outreach Note  11/01/2019 Name: Norman Bradley MRN: [id number] DOB: 1951/01/19  Referred by: Libby Maw, MD Reason for referral : No chief complaint on file.   An unsuccessful telephone outreach was attempted today. The patient was referred to the pharmacist for assistance with care management and care coordination.   This note is not being shared with the patient for the following reason: To respect privacy (The patient or proxy has requested that the information not be shared).  Follow Up Plan:   [name] Upstream Scheduler

## 2019-11-02 NOTE — Progress Notes (Signed)
Assessment/Plan:    1.  Tremor  -I didn't see any tremor today.  He certainly does not meet [place] brain bank criteria for the diagnosis of Parkinson's disease, but I also told him that is not minimal development of future.  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  He was agreeable to that.  -I will check a TSH and also check his chemistry.  He does have elevated liver enzymes.  I told him that this should further be followed with his primary care physician, although they are just very mildly elevated.  If not already done so, hepatitis panel should likely be checked, if appropriate.  Again, I would leave this to his primary care physician.  2.  rbd  -We discussed nature and pathophysiology.  He has had this for a number of years.  Discussed that this can be associated with Parkinson's disease, but he certainly had no evidence of this today.  This can occur independently of Parkinson's disease as well.  3.  Memory change  -no evidence on hx/exam of neurodegen process.  Did discuss neurocog testing but don't think needed at this time  4.  F/u 9 months, sooner should new issues arise.  Subjective:   Norman Bradley was seen today in the movement disorders clinic for neurologic consultation at the request of Libby Maw,*.  The consultation is for the evaluation of L hand rest tremor.     Tremor: Yes.     How long has it been going on? Over a year, getting worse  At rest or with activation?  Rest and activation - notices when going to eat/drink glass of wine  Fam hx of tremor?  No.  Located where?  L arm only  Affected by caffeine:  No.  Affected by alcohol:  Doesn't know  Affected by stress:  No.  Affected by fatigue:  No.  Other sx's: Voice: no change Sleep: sleeps well until 5:30am and then arises  Vivid Dreams:  No.  Acting out dreams:  Yes.  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 Postural symptoms:  No.  Falls?  No. Bradykinesia symptoms: no bradykinesia noted Loss of smell:  No. Loss of taste:  No. Urinary Incontinence:  No. Difficulty Swallowing:  No. Handwriting, micrographia: No. Trouble with ADL's:  No.  Trouble buttoning clothing: No. Depression:  No. but some anxiety Memory changes:  Yes.  , a little (probably b/c has too much on mind - might forget to be at a meeting) N/V:  No. Lightheaded:  No.  Syncope: No. Diplopia:  No. Dyskinesia:  No. Prior exposure to reglan/antipsychotics: No.  Neuroimaging of the brain has not previously been performed.    PREVIOUS MEDICATIONS: none to date  ALLERGIES:  No Known Allergies  CURRENT MEDICATIONS:  Current Outpatient Medications  Medication Instructions  . Cholecalciferol (VITAMIN D3 GUMMIES PO) 20,000 Units, Oral, Daily  . Cyanocobalamin (B-12) 5000 MCG CAPS 1 tablet, Oral, Daily  . glucose blood (ONE TOUCH ULTRA TEST) test strip Use to test blood sugars once daily.  . metFORMIN (GLUCOPHAGE) 1000 MG tablet TAKE 1 TABLET(1000 MG) BY MOUTH TWICE DAILY WITH A MEAL  . pravastatin (PRAVACHOL) 20 mg, Oral, Daily  . RESVERATROL PO 2,000 mg, Oral, Daily  . thiamine (VITAMIN B-1) 100 mg, Oral, Daily  .  trandolapril (MAVIK) 1 MG tablet TAKE 1 TABLET(1 MG) BY MOUTH DAILY  . Zinc 30 MG TABS 1 tablet, Oral, Daily    Objective:   VITALS:   Vitals:   11/04/19 0939  BP: 133/85  Pulse: 76  SpO2: 97%  Weight: 243 lb (110.2 kg)  Height: 5\' 11"  (1.803 m)    GEN:  The patient appears stated age and is in NAD. HEENT:  Normocephalic, atraumatic.  The mucous membranes are moist. The superficial temporal arteries are without ropiness or tenderness. CV:  RRR Lungs:  CTAB Neck/HEME:  There are no carotid bruits bilaterally.  Neurological examination:  Orientation: The patient is alert and oriented x3.  Cranial nerves: There is good  facial symmetry. Extraocular muscles are intact but has alternatating strabismus (congenital). The visual fields are full to confrontational testing. The speech is fluent and clear. Soft palate rises symmetrically and there is no tongue deviation. Hearing is intact to conversational tone. Sensation: Sensation is intact to light and pinprick throughout (facial, trunk, extremities). Vibration is absent at the bilateral big toe but intact at the bilateral ankle. There is no extinction with double simultaneous stimulation. There is no sensory dermatomal level identified. Motor: Strength is 5/5 in the bilateral upper and lower extremities.   Shoulder shrug is equal and symmetric.  There is no pronator drift. Deep tendon reflexes: Deep tendon reflexes are 2-/4 at the bilateral biceps, triceps, brachioradialis, patella and achilles. Plantar responses are downgoing bilaterally.  Movement examination: Tone: There is normal tone in the bilateral upper extremities.  The tone in the lower extremities is normal.  Abnormal movements: There is no rest tremor, even with distraction procedures.  There is no postural tremor.  There is no intention tremor.  No tremor with Archimedes spirals.  Able to pour water from one glass to another without spilling it.  Coordination:  There is no decremation with RAM's, with any form of RAMS, including alternating supination and pronation of the forearm, hand opening and closing, finger taps, heel taps and toe taps. Gait and Station: The patient has no difficulty arising out of a deep-seated chair without the use of the hands. The patient's stride length is good.      I have reviewed and interpreted the following labs independently   Chemistry      Component Value Date/Time   NA 134 (L) 10/17/2019 1018   K 4.2 10/17/2019 1018   CL 98 10/17/2019 1018   CO2 28 10/17/2019 1018   BUN 16 10/17/2019 1018   CREATININE 0.99 10/17/2019 1018      Component Value Date/Time   CALCIUM  8.9 10/17/2019 1018   ALKPHOS 56 10/17/2019 1018   AST 53 (H) 10/17/2019 1018   ALT 67 (H) 10/17/2019 1018   BILITOT 1.6 (H) 10/17/2019 1018      Lab Results  Component Value Date   TSH 3.48 05/27/2017   Lab Results  Component Value Date   WBC 4.8 10/17/2019   HGB 15.1 10/17/2019   HCT 43.9 10/17/2019   MCV 89.1 10/17/2019   PLT 141.0 (L) 10/17/2019   Lab Results  Component Value Date   HGBA1C 6.9 (H) 10/17/2019     Total time spent on today's visit was 60 minutes, including both face-to-face time and nonface-to-face time.  Time included that spent on review of records (prior notes available to me/labs/imaging if pertinent), discussing treatment and goals, answering patient's questions and coordinating care.  Cc:  Libby Maw, MD

## 2019-11-03 ENCOUNTER — Telehealth: Payer: Medicare Other

## 2019-11-04 ENCOUNTER — Ambulatory Visit: Payer: Medicare Other | Admitting: Neurology

## 2019-11-04 ENCOUNTER — Other Ambulatory Visit: Payer: Self-pay

## 2019-11-04 ENCOUNTER — Other Ambulatory Visit: Payer: Medicare Other

## 2019-11-04 ENCOUNTER — Encounter: Payer: Self-pay | Admitting: Neurology

## 2019-11-04 VITALS — BP 133/85 | HR 76 | Ht 71.0 in | Wt 243.0 lb

## 2019-11-04 DIAGNOSIS — G4752 REM sleep behavior disorder: Secondary | ICD-10-CM | POA: Diagnosis not present

## 2019-11-04 DIAGNOSIS — R413 Other amnesia: Secondary | ICD-10-CM | POA: Diagnosis not present

## 2019-11-04 DIAGNOSIS — R251 Tremor, unspecified: Secondary | ICD-10-CM

## 2019-11-04 DIAGNOSIS — R7401 Elevation of levels of liver transaminase levels: Secondary | ICD-10-CM

## 2019-11-04 NOTE — Patient Instructions (Addendum)
1. Your provider has requested that you have labwork completed today. Please go to [facility] ([address]) on the second floor of this building before leaving the office today. You do not need to check in. If you are not called within 15 minutes please check with the front desk.   

## 2019-11-07 ENCOUNTER — Telehealth: Payer: Self-pay | Admitting: Family Medicine

## 2019-11-07 NOTE — Progress Notes (Signed)
  Chronic Care Management   Outreach Note  11/07/2019 Name: JRUE ORMES MRN: [id number] DOB: [birth year]  Referred by: Libby Maw, MD Reason for referral : No chief complaint on file.   An unsuccessful telephone outreach was attempted today. The patient was referred to the pharmacist for assistance with care management and care coordination.   This note is not being shared with the patient for the following reason: To respect privacy (The patient or proxy has requested that the information not be shared).  Follow Up Plan:   [name] Upstream Scheduler

## 2019-11-09 ENCOUNTER — Telehealth: Payer: Self-pay

## 2019-11-09 NOTE — Telephone Encounter (Signed)
Tat, Eustace Quail, DO to Harold Hedge, CMA Doesn't look like patient had these done? If not, remind him          Left message advising patient to come to the office to have his labs drawn.

## 2019-11-10 ENCOUNTER — Telehealth: Payer: Self-pay | Admitting: Family Medicine

## 2019-11-10 NOTE — Progress Notes (Signed)
  Chronic Care Management   Outreach Note  11/10/2019 Name: Norman Bradley MRN: [id number] DOB: 1950/08/14  Referred by: Libby Maw, MD Reason for referral : No chief complaint on file.   An unsuccessful telephone outreach was attempted today. The patient was referred to the pharmacist for assistance with care management and care coordination.   This note is not being shared with the patient for the following reason: To respect privacy (The patient or proxy has requested that the information not be shared).  Follow Up Plan:   [name] Upstream Scheduler

## 2019-11-16 ENCOUNTER — Telehealth: Payer: Self-pay | Admitting: Family Medicine

## 2019-11-16 NOTE — Progress Notes (Signed)
  Chronic Care Management   Outreach Note  11/16/2019 Name: Norman Bradley MRN: [id number] DOB: 03-31-1951  Referred by: Libby Maw, MD Reason for referral : No chief complaint on file.   An unsuccessful telephone outreach was attempted today. The patient was referred to the pharmacist for assistance with care management and care coordination.   This note is not being shared with the patient for the following reason: To respect privacy (The patient or proxy has requested that the information not be shared).  Follow Up Plan:   SIGNATURE

## 2019-11-21 ENCOUNTER — Other Ambulatory Visit: Payer: Self-pay | Admitting: Family Medicine

## 2019-11-22 NOTE — Telephone Encounter (Signed)
Called patient to schedule appointment no answer LMTCB  

## 2019-11-23 ENCOUNTER — Other Ambulatory Visit: Payer: Self-pay | Admitting: Family Medicine

## 2019-11-23 DIAGNOSIS — E119 Type 2 diabetes mellitus without complications: Secondary | ICD-10-CM

## 2019-11-24 ENCOUNTER — Telehealth: Payer: Self-pay | Admitting: Family Medicine

## 2019-11-24 MED ORDER — METFORMIN HCL 1000 MG PO TABS: ORAL_TABLET | ORAL | 1 refills | Status: DC

## 2019-11-24 NOTE — Progress Notes (Signed)
  Chronic Care Management   Outreach Note  11/24/2019 Name: NIEEM RAYFORD MRN: [id number] DOB: 1950-07-01  Referred by: Libby Maw, MD Reason for referral : No chief complaint on file.   Third unsuccessful telephone outreach was attempted today. The patient was referred to the pharmacist for assistance with care management and care coordination.   This note is not being shared with the patient for the following reason: To respect privacy (The patient or proxy has requested that the information not be shared).  Follow Up Plan:   [name] Upstream Scheduler

## 2019-11-29 ENCOUNTER — Other Ambulatory Visit: Payer: Self-pay | Admitting: Family Medicine

## 2019-11-29 DIAGNOSIS — R030 Elevated blood-pressure reading, without diagnosis of hypertension: Secondary | ICD-10-CM

## 2019-11-29 DIAGNOSIS — E7800 Pure hypercholesterolemia, unspecified: Secondary | ICD-10-CM

## 2019-11-29 DIAGNOSIS — E119 Type 2 diabetes mellitus without complications: Secondary | ICD-10-CM

## 2019-12-27 ENCOUNTER — Telehealth: Payer: Self-pay | Admitting: Family Medicine

## 2019-12-27 NOTE — Telephone Encounter (Signed)
Left message for patient to schedule Annual Wellness Visit.  Please schedule with Nurse Health Advisor Martha Stanley, RN at [facility]  °

## 2020-02-27 ENCOUNTER — Other Ambulatory Visit: Payer: Self-pay | Admitting: Family Medicine

## 2020-02-27 DIAGNOSIS — E119 Type 2 diabetes mellitus without complications: Secondary | ICD-10-CM

## 2020-02-27 DIAGNOSIS — R030 Elevated blood-pressure reading, without diagnosis of hypertension: Secondary | ICD-10-CM

## 2020-03-21 LAB — HM DIABETES EYE EXAM

## 2020-04-16 ENCOUNTER — Ambulatory Visit: Payer: Medicare Other | Admitting: Family Medicine

## 2020-04-25 DIAGNOSIS — Z03818 Encounter for observation for suspected exposure to other biological agents ruled out: Secondary | ICD-10-CM | POA: Diagnosis not present

## 2020-04-30 DIAGNOSIS — Z20822 Contact with and (suspected) exposure to covid-19: Secondary | ICD-10-CM | POA: Diagnosis not present

## 2020-05-01 ENCOUNTER — Telehealth: Payer: Self-pay

## 2020-05-01 NOTE — Progress Notes (Signed)
Please void note, open in error.  Bessie Kellihan,CPA Clinical Pharmacist Assistant [phone]   

## 2020-05-03 ENCOUNTER — Telehealth: Payer: Self-pay

## 2020-05-03 NOTE — Progress Notes (Signed)
I have attempted without success to contact this patient by phone three times to do his Hyperlipidemia  Disease State call. I left a Voice message for patient to return my call.   [facility] Pharmacist Assistant [phone]

## 2020-05-08 ENCOUNTER — Encounter: Payer: Self-pay | Admitting: Family Medicine

## 2020-05-25 ENCOUNTER — Other Ambulatory Visit: Payer: Self-pay | Admitting: Family Medicine

## 2020-05-25 DIAGNOSIS — R030 Elevated blood-pressure reading, without diagnosis of hypertension: Secondary | ICD-10-CM

## 2020-05-25 DIAGNOSIS — E119 Type 2 diabetes mellitus without complications: Secondary | ICD-10-CM

## 2020-05-29 ENCOUNTER — Encounter: Payer: Self-pay | Admitting: Family Medicine

## 2020-06-12 DIAGNOSIS — Z20822 Contact with and (suspected) exposure to covid-19: Secondary | ICD-10-CM | POA: Diagnosis not present

## 2020-07-05 ENCOUNTER — Ambulatory Visit: Payer: Medicare Other | Admitting: Family Medicine

## 2020-08-06 ENCOUNTER — Ambulatory Visit: Payer: Medicare Other | Admitting: Neurology

## 2020-08-09 ENCOUNTER — Telehealth (INDEPENDENT_AMBULATORY_CARE_PROVIDER_SITE_OTHER): Payer: Medicare Other | Admitting: Family Medicine

## 2020-08-09 DIAGNOSIS — R059 Cough, unspecified: Secondary | ICD-10-CM | POA: Diagnosis not present

## 2020-08-09 DIAGNOSIS — Z20822 Contact with and (suspected) exposure to covid-19: Secondary | ICD-10-CM | POA: Diagnosis not present

## 2020-08-09 MED ORDER — BENZONATATE 100 MG PO CAPS: 100.0000 mg | ORAL_CAPSULE | Freq: Three times a day (TID) | ORAL | 0 refills | Status: DC | PRN

## 2020-08-09 NOTE — Progress Notes (Signed)
Virtual Visit via Video Note  I connected with Norman Bradley  on 08/09/20 at 11:20 AM EST by a video enabled telemedicine application and verified that I am speaking with the correct person using two identifiers.  Location patient: home, [place] Location provider:work or home office Persons participating in the virtual visit: patient, provider  I discussed the limitations of evaluation and management by telemedicine and the availability of in person appointments. The patient expressed understanding and agreed to proceed.   HPI:  Acute telemedicine visit for cough and congestion: -Onset: yesterday -Symptoms include: felt like a cold coming on yesterday, sneezing, PND, scratchy, today cough -wife sick with the same -had negative covid test this morning -Denies: CP except for when coughs, SOB, NVD, inability to eat/drink/get out of bed -Has tried:advil and benadryl -Pertinent past medical history: diabetes - well controlled per patient, HTN -Pertinent medication allergies: nkda -COVID-19 vaccine status: fully vaccinated for covid + booster, had flu shot  ROS: See pertinent positives and negatives per HPI.  Past Medical History:  Diagnosis Date  . Diabetes mellitus without complication [facility])     Past Surgical History:  Procedure Laterality Date  . STRABISMUS SURGERY       Current Outpatient Medications:  .  benzonatate (TESSALON PERLES) 100 MG capsule, Take 1 capsule (100 mg total) by mouth 3 (three) times daily as needed., Disp: 20 capsule, Rfl: 0 .  Cholecalciferol (VITAMIN D3 GUMMIES PO), Take 20,000 Units by mouth daily., Disp: , Rfl:  .  Cyanocobalamin (B-12) 5000 MCG CAPS, Take 1 tablet by mouth daily., Disp: , Rfl:  .  glucose blood (ONE TOUCH ULTRA TEST) test strip, Use to test blood sugars once daily., Disp: 100 each, Rfl: 3 .  metFORMIN (GLUCOPHAGE) 1000 MG tablet, TAKE 1 TABLET(1000 MG) BY MOUTH TWICE DAILY WITH A MEAL, Disp: 180 tablet, Rfl: 1 .  pravastatin (PRAVACHOL) 20 MG  tablet, TAKE 1 TABLET(20 MG) BY MOUTH DAILY, Disp: 90 tablet, Rfl: 3 .  RESVERATROL PO, Take 2,000 mg by mouth daily., Disp: , Rfl:  .  thiamine (VITAMIN B-1) 100 MG tablet, Take 100 mg by mouth daily., Disp: , Rfl:  .  trandolapril (MAVIK) 1 MG tablet, TAKE 1 TABLET(1 MG) BY MOUTH DAILY, Disp: 90 tablet, Rfl: 0 .  Zinc 30 MG TABS, Take 1 tablet by mouth daily., Disp: , Rfl:   EXAM:  VITALS per patient if applicable:  GENERAL: alert, oriented, appears well and in no acute distress  HEENT: atraumatic, conjunttiva clear, no obvious abnormalities on inspection of external nose and ears  NECK: normal movements of the head and neck  LUNGS: on inspection no signs of respiratory distress, breathing rate appears normal, no obvious gross SOB, gasping or wheezing  CV: no obvious cyanosis  MS: moves all visible extremities without noticeable abnormality  PSYCH/NEURO: pleasant and cooperative, no obvious depression or anxiety, speech and thought processing grossly intact  ASSESSMENT AND PLAN:  Discussed the following assessment and plan:  Cough  -we discussed possible serious and likely etiologies, options for evaluation and workup, limitations of telemedicine visit vs in person visit, treatment, treatment risks and precautions. Pt prefers to treat via telemedicine empirically rather than in person at this moment.  Query viral respiratory illness versus other.  He opted to try Tessalon for cough, repeat Covid testing in a day, other home care measures summarized in patient instructions.  Agrees to stay home while sick. Work/School slipped offered:  declined Scheduled follow up with PCP offered: Agrees to schedule  follow-up visit through PCP office if needed. Advised to schedule follow-up video visit or seek prompt in person care if worsening, new symptoms arise, or if is not improving with treatment. Discussed options for inperson care if PCP office not available. Did let this patient know that  I only do telemedicine on Tuesdays and Thursdays for [place]. Advised to schedule follow up visit with PCP or [facility] if any further questions or concerns to avoid delays in care.   I discussed the assessment and treatment plan with the patient. The patient was provided an opportunity to ask questions and all were answered. The patient agreed with the plan and demonstrated an understanding of the instructions.     Lucretia Kern, DO  [facility] [place] can you get me

## 2020-08-09 NOTE — Patient Instructions (Signed)
  HOME CARE TIPS:  -[place] testing information: [email or web address].[facility].org/ OR [phone] Most pharmacies also offer testing and home test kits.  If you do another Covid test and is positive, please make a prompt follow-up video visit through your primary care office or through [facility] health.  -I sent the medication(s) we discussed to your pharmacy: Meds ordered this encounter  Medications  . benzonatate (TESSALON PERLES) 100 MG capsule    Sig: Take 1 capsule (100 mg total) by mouth 3 (three) times daily as needed.    Dispense:  20 capsule    Refill:  0     -can use tylenol or aleve if needed for fevers, aches and pains per instructions  -can use nasal saline a few times per day if you have nasal congestion  -stay hydrated, drink plenty of fluids and eat small healthy meals - avoid dairy  -can take 1000 IU (40mcg) Vit D3 and 100-500 mg of Vit C daily per instructions  -stay home while sick, except to seek medical care, and if you have COVID19 ideally it would be best to stay home for a full 10 days since the onset of symptoms PLUS one day of no fever and feeling better. Wear a good mask (such as N95 or KN95) if around others to reduce the risk of transmission.  It was nice to meet you today, and I really hope you are feeling better soon. I help [facility] out with telemedicine visits on Tuesdays and Thursdays and am available for visits on those days. If you have any concerns or questions following this visit please schedule a follow up visit with your Primary Care doctor or seek care at a local urgent care clinic to avoid delays in care.    Seek in person care or schedule a follow up video visit promptly if your symptoms worsen, new concerns arise or you are not improving with treatment. Call 911 and/or seek emergency care if your symptoms are severe or life threatening.

## 2020-08-10 ENCOUNTER — Telehealth: Payer: Self-pay | Admitting: Family Medicine

## 2020-08-10 NOTE — Telephone Encounter (Signed)
Left message for patient to schedule Annual Wellness Visit.  Please schedule with Nurse Health Advisor Martha Stanley, RN at [facility]  °

## 2020-08-27 ENCOUNTER — Ambulatory Visit: Payer: Medicare Other | Admitting: Family Medicine

## 2020-09-17 ENCOUNTER — Other Ambulatory Visit: Payer: Self-pay | Admitting: Family Medicine

## 2020-09-17 DIAGNOSIS — L812 Freckles: Secondary | ICD-10-CM | POA: Diagnosis not present

## 2020-09-17 DIAGNOSIS — E119 Type 2 diabetes mellitus without complications: Secondary | ICD-10-CM

## 2020-09-17 DIAGNOSIS — L218 Other seborrheic dermatitis: Secondary | ICD-10-CM | POA: Diagnosis not present

## 2020-09-17 DIAGNOSIS — D225 Melanocytic nevi of trunk: Secondary | ICD-10-CM | POA: Diagnosis not present

## 2020-09-17 DIAGNOSIS — L570 Actinic keratosis: Secondary | ICD-10-CM | POA: Diagnosis not present

## 2020-09-17 DIAGNOSIS — D1801 Hemangioma of skin and subcutaneous tissue: Secondary | ICD-10-CM | POA: Diagnosis not present

## 2020-09-17 DIAGNOSIS — L821 Other seborrheic keratosis: Secondary | ICD-10-CM | POA: Diagnosis not present

## 2020-10-05 ENCOUNTER — Other Ambulatory Visit: Payer: Self-pay | Admitting: Family

## 2020-10-05 DIAGNOSIS — E119 Type 2 diabetes mellitus without complications: Secondary | ICD-10-CM

## 2020-10-05 DIAGNOSIS — R030 Elevated blood-pressure reading, without diagnosis of hypertension: Secondary | ICD-10-CM

## 2020-10-08 NOTE — Progress Notes (Signed)
Assessment/Plan:   1.  Tremor  -rare tremor of L hand with pouring water.  Pt doesn't want med and I would not recommend.  Does not look dystonic.  Should be essential tremor, but strange that it is so rare and does not happen with most activities.  No evidence of Parkinson's disease.  Patient will let me know if symptom becomes more bothersome.  -Wanted to get lab work today including TSH and chemistry, but patient states that he does have upcoming appointment with primary care in the next few weeks and he would like to get that at primary care office instead.    2.  Patient to follow-up with me on an as-needed basis.  Subjective:   Norman Bradley was seen today in follow up for tremor.  My previous records were reviewed prior to todays visit as well as outside records available to me.  When seen last May, the patient certainly did not meet criteria for the diagnosis of Parkinson's disease, but was complaining about unilateral left hand tremor (not seen last visit).  However, we decided to follow-up with him given that he did have a history of RBD as well.  We ordered lab work for him to have done last visit, but the patient never did have them done.  States he tried to have them done but line was too long.   Besides for telemedicine visit for cough, he otherwise has not been seen by primary care since our last visit either.    Today, still noting tremor on the L when he pours a bottle of wine.  He does not notice it writing even though he is left handed.  Never at rest.  Doesn't happen with drinking out of a glass.  Caffeine doesn't change it.  Still some vivid dreams - not active enough to hurt wife but he will sleep walk.     ALLERGIES:  No Known Allergies  CURRENT MEDICATIONS:  Outpatient Encounter Medications as of 10/09/2020  Medication Sig  . metFORMIN (GLUCOPHAGE) 1000 MG tablet TAKE 1 TABLET(1000 MG) BY MOUTH TWICE DAILY WITH A MEAL  . pravastatin (PRAVACHOL) 20 MG  tablet TAKE 1 TABLET(20 MG) BY MOUTH DAILY  . trandolapril (MAVIK) 1 MG tablet TAKE 1 TABLET(1 MG) BY MOUTH DAILY  . [DISCONTINUED] benzonatate (TESSALON PERLES) 100 MG capsule Take 1 capsule (100 mg total) by mouth 3 (three) times daily as needed.  . [DISCONTINUED] Cholecalciferol (VITAMIN D3 GUMMIES PO) Take 20,000 Units by mouth daily.  . [DISCONTINUED] Cyanocobalamin (B-12) 5000 MCG CAPS Take 1 tablet by mouth daily.  . [DISCONTINUED] glucose blood (ONE TOUCH ULTRA TEST) test strip Use to test blood sugars once daily.  . [DISCONTINUED] RESVERATROL PO Take 2,000 mg by mouth daily.  . [DISCONTINUED] thiamine (VITAMIN B-1) 100 MG tablet Take 100 mg by mouth daily.  . [DISCONTINUED] Zinc 30 MG TABS Take 1 tablet by mouth daily.   No facility-administered encounter medications on file as of 10/09/2020.    Objective:   PHYSICAL EXAMINATION:    VITALS:   Vitals:   10/09/20 1300  BP: 112/74  Pulse: 80  SpO2: 98%  Weight: 250 lb (113.4 kg)  Height: 5\' 11"  (1.803 m)    GEN:  The patient appears stated age and is in NAD. HEENT:  Normocephalic, atraumatic.  The mucous membranes are moist.   Neurological examination:  Orientation: The patient is alert and oriented x3. Cranial nerves: There is good facial symmetry without facial hypomimia. The  speech is fluent and clear. Soft palate rises symmetrically and there is no tongue deviation. Hearing is intact to conversational tone. Sensation: Sensation is intact to light touch throughout Motor: Strength is at least antigravity x4.  Movement examination: Tone: There is normal tone in the upper and lower extremities. Abnormal movements: No rest tremor.  No postural tremor.  No intention tremor.  No asterixis.  No trouble with Archimedes spirals.  No trouble writing a sentence.  Very mild evidence of tremor when he pours water, when the water is in the left hand. Coordination:  There is no decremation with RAM's   I have reviewed and  interpreted the following labs independently    Chemistry      Component Value Date/Time   NA 134 (L) 10/17/2019 1018   K 4.2 10/17/2019 1018   CL 98 10/17/2019 1018   CO2 28 10/17/2019 1018   BUN 16 10/17/2019 1018   CREATININE 0.99 10/17/2019 1018      Component Value Date/Time   CALCIUM 8.9 10/17/2019 1018   ALKPHOS 56 10/17/2019 1018   AST 53 (H) 10/17/2019 1018   ALT 67 (H) 10/17/2019 1018   BILITOT 1.6 (H) 10/17/2019 1018       Lab Results  Component Value Date   WBC 4.8 10/17/2019   HGB 15.1 10/17/2019   HCT 43.9 10/17/2019   MCV 89.1 10/17/2019   PLT 141.0 (L) 10/17/2019    Lab Results  Component Value Date   TSH 3.48 05/27/2017     Total time spent on today's visit was 20 minutes, including both face-to-face time and nonface-to-face time.  Time included that spent on review of records (prior notes available to me/labs/imaging if pertinent), discussing treatment and goals, answering patient's questions and coordinating care.  Cc:  Libby Maw, MD

## 2020-10-09 ENCOUNTER — Encounter: Payer: Self-pay | Admitting: Neurology

## 2020-10-09 ENCOUNTER — Other Ambulatory Visit: Payer: Self-pay

## 2020-10-09 ENCOUNTER — Ambulatory Visit: Payer: Medicare Other | Admitting: Neurology

## 2020-10-09 VITALS — BP 112/74 | HR 80 | Ht 71.0 in | Wt 250.0 lb

## 2020-10-09 DIAGNOSIS — R251 Tremor, unspecified: Secondary | ICD-10-CM | POA: Diagnosis not present

## 2020-10-09 NOTE — Patient Instructions (Signed)
Let us know if tremor gets worse or if you need to see me in the future.  Happy [place]!  The physicians and staff at [facility] are committed to providing excellent care. You may receive a survey requesting feedback about your experience at our office. We strive to receive "very good" responses to the survey questions. If you feel that your experience would prevent you from giving the office a "very good " response, please contact our office to try to remedy the situation. We may be reached at [phone]. Thank you for taking the time out of your busy day to complete the survey.

## 2020-10-10 ENCOUNTER — Other Ambulatory Visit: Payer: Self-pay | Admitting: Family Medicine

## 2020-10-10 DIAGNOSIS — E7800 Pure hypercholesterolemia, unspecified: Secondary | ICD-10-CM

## 2020-10-10 NOTE — Telephone Encounter (Signed)
Last OV 10/17/19 Last fill 11/29/19  #90/3 Next OV 10/11/20

## 2020-10-11 ENCOUNTER — Other Ambulatory Visit: Payer: Self-pay

## 2020-10-11 ENCOUNTER — Encounter: Payer: Self-pay | Admitting: Family Medicine

## 2020-10-11 ENCOUNTER — Ambulatory Visit (INDEPENDENT_AMBULATORY_CARE_PROVIDER_SITE_OTHER): Payer: Medicare Other | Admitting: Family Medicine

## 2020-10-11 VITALS — BP 132/82 | HR 78 | Temp 97.2°F | Ht 71.0 in | Wt 249.6 lb

## 2020-10-11 DIAGNOSIS — Z125 Encounter for screening for malignant neoplasm of prostate: Secondary | ICD-10-CM

## 2020-10-11 DIAGNOSIS — I10 Essential (primary) hypertension: Secondary | ICD-10-CM

## 2020-10-11 DIAGNOSIS — R7989 Other specified abnormal findings of blood chemistry: Secondary | ICD-10-CM | POA: Diagnosis not present

## 2020-10-11 DIAGNOSIS — R251 Tremor, unspecified: Secondary | ICD-10-CM

## 2020-10-11 DIAGNOSIS — E7800 Pure hypercholesterolemia, unspecified: Secondary | ICD-10-CM

## 2020-10-11 DIAGNOSIS — E119 Type 2 diabetes mellitus without complications: Secondary | ICD-10-CM

## 2020-10-11 DIAGNOSIS — E559 Vitamin D deficiency, unspecified: Secondary | ICD-10-CM | POA: Diagnosis not present

## 2020-10-11 DIAGNOSIS — Z0000 Encounter for general adult medical examination without abnormal findings: Secondary | ICD-10-CM

## 2020-10-11 LAB — LIPID PANEL
Cholesterol: 118 mg/dL (ref 0–200)
HDL: 28.7 mg/dL — ABNORMAL LOW (ref >39.00)
NonHDL: 89.28
Total CHOL/HDL Ratio: 4
Triglycerides: 250 mg/dL — ABNORMAL HIGH (ref 0.0–149.0)
VLDL: 50 mg/dL — ABNORMAL HIGH (ref 0.0–40.0)

## 2020-10-11 LAB — HEPATIC FUNCTION PANEL
ALT: 101 U/L — ABNORMAL HIGH (ref 0–53)
AST: 80 U/L — ABNORMAL HIGH (ref 0–37)
Albumin: 4 g/dL (ref 3.5–5.2)
Alkaline Phosphatase: 53 U/L (ref 39–117)
Bilirubin, Direct: 0.3 mg/dL (ref 0.0–0.3)
Total Bilirubin: 1.6 mg/dL — ABNORMAL HIGH (ref 0.2–1.2)
Total Protein: 7.3 g/dL (ref 6.0–8.3)

## 2020-10-11 LAB — CBC
HCT: 47.1 % (ref 39.0–52.0)
Hemoglobin: 16 g/dL (ref 13.0–17.0)
MCHC: 34 g/dL (ref 30.0–36.0)
MCV: 89.4 fl (ref 78.0–100.0)
Platelets: 144 10*3/uL — ABNORMAL LOW (ref 150.0–400.0)
RBC: 5.27 Mil/uL (ref 4.22–5.81)
RDW: 13.6 % (ref 11.5–15.5)
WBC: 5.8 10*3/uL (ref 4.0–10.5)

## 2020-10-11 LAB — MICROALBUMIN / CREATININE URINE RATIO
Creatinine,U: 122.6 mg/dL
Microalb Creat Ratio: 0.6 mg/g (ref 0.0–30.0)
Microalb, Ur: <0.7 mg/dL (ref 0.0–1.9)

## 2020-10-11 LAB — BASIC METABOLIC PANEL
BUN: 17 mg/dL (ref 6–23)
CO2: 25 mEq/L (ref 19–32)
Calcium: 9.1 mg/dL (ref 8.4–10.5)
Chloride: 99 mEq/L (ref 96–112)
Creatinine, Ser: 1.08 mg/dL (ref 0.40–1.50)
GFR: 70.02 mL/min (ref >60.00)
Glucose, Bld: 156 mg/dL — ABNORMAL HIGH (ref 70–99)
Potassium: 4.3 mEq/L (ref 3.5–5.1)
Sodium: 134 mEq/L — ABNORMAL LOW (ref 135–145)

## 2020-10-11 LAB — URINALYSIS, ROUTINE W REFLEX MICROSCOPIC
Bilirubin Urine: NEGATIVE
Hgb urine dipstick: NEGATIVE
Ketones, ur: NEGATIVE
Leukocytes,Ua: NEGATIVE
Nitrite: NEGATIVE
RBC / HPF: NONE SEEN (ref 0–?)
Specific Gravity, Urine: 1.02 (ref 1.000–1.030)
Total Protein, Urine: NEGATIVE
Urine Glucose: NEGATIVE
Urobilinogen, UA: 4 — AB (ref 0.0–1.0)
WBC, UA: NONE SEEN (ref 0–?)
pH: 5.5 (ref 5.0–8.0)

## 2020-10-11 LAB — VITAMIN D 25 HYDROXY (VIT D DEFICIENCY, FRACTURES): VITD: 35.63 ng/mL (ref 30.00–100.00)

## 2020-10-11 LAB — PSA: PSA: 1.1 ng/mL (ref 0.10–4.00)

## 2020-10-11 LAB — TSH: TSH: 4.22 u[IU]/mL (ref 0.35–4.50)

## 2020-10-11 LAB — HEMOGLOBIN A1C: Hgb A1c MFr Bld: 8 % — ABNORMAL HIGH (ref 4.6–6.5)

## 2020-10-11 LAB — LDL CHOLESTEROL, DIRECT: Direct LDL: 66 mg/dL

## 2020-10-11 NOTE — Addendum Note (Signed)
Addended by: Jon Billings on: 10/11/2020 03:17 PM   Modules accepted: Orders

## 2020-10-11 NOTE — Patient Instructions (Addendum)
Diabetes Mellitus and Exercise Exercising regularly is important for overall health, especially for people who have diabetes mellitus. Exercising is not only about losing weight. It has many other health benefits, such as increasing muscle strength and bone density and reducing body fat and stress. This leads to improved fitness, flexibility, and endurance, all of which result in better overall health. What are the benefits of exercise if I have diabetes? Exercise has many benefits for people with diabetes. They include:  Helping to lower and control blood sugar (glucose).  Helping the body to respond better to the hormone insulin by improving insulin sensitivity.  Reducing how much insulin the body needs.  Lowering the risk for heart disease by: ? Lowering "bad" cholesterol and triglyceride levels. ? Increasing "good" cholesterol levels. ? Lowering blood pressure. ? Lowering blood glucose levels. What is my activity plan? Your health care provider or certified diabetes educator can help you make a plan for the type and frequency of exercise that works for you. This is called your activity plan. Be sure to:  Get at least 150 minutes of medium-intensity or high-intensity exercise each week. Exercises may include brisk walking, biking, or water aerobics.  Do stretching and strengthening exercises, such as yoga or weight lifting, at least 2 times a week.  Spread out your activity over at least 3 days of the week.  Get some form of physical activity each day. ? Do not go more than 2 days in a row without some kind of physical activity. ? Avoid being inactive for more than 90 minutes at a time. Take frequent breaks to walk or stretch.  Choose exercises or activities that you enjoy. Set realistic goals.  Start slowly and gradually increase your exercise intensity over time.   How do I manage my diabetes during exercise? Monitor your blood glucose  Check your blood glucose before and  after exercising. If your blood glucose is: ? 240 mg/dL (13.3 mmol/L) or higher before you exercise, check your urine for ketones. These are chemicals created by the liver. If you have ketones in your urine, do not exercise until your blood glucose returns to normal. ? 100 mg/dL (5.6 mmol/L) or lower, eat a snack containing 15-20 grams of carbohydrate. Check your blood glucose 15 minutes after the snack to make sure that your glucose level is above 100 mg/dL (5.6 mmol/L) before you start your exercise.  Know the symptoms of low blood glucose (hypoglycemia) and how to treat it. Your risk for hypoglycemia increases during and after exercise. Follow these tips and your health care provider's instructions  Keep a carbohydrate snack that is fast-acting for use before, during, and after exercise to help prevent or treat hypoglycemia.  Avoid injecting insulin into areas of the body that are going to be exercised. For example, avoid injecting insulin into: ? Your arms, when you are about to play tennis. ? Your legs, when you are about to go jogging.  Keep records of your exercise habits. Doing this can help you and your health care provider adjust your diabetes management plan as needed. Write down: ? Food that you eat before and after you exercise. ? Blood glucose levels before and after you exercise. ? The type and amount of exercise you have done.  Work with your health care provider when you start a new exercise or activity. He or she may need to: ? Make sure that the activity is safe for you. ? Adjust your insulin, other medicines, and food that   you eat.  Drink plenty of water while you exercise. This prevents loss of water (dehydration) and problems caused by a lot of heat in the body (heat stroke).   Where to find more information  American Diabetes Association: www.diabetes.org Summary  Exercising regularly is important for overall health, especially for people who have diabetes  mellitus.  Exercising has many health benefits. It increases muscle strength and bone density and reduces body fat and stress. It also lowers and controls blood glucose.  Your health care provider or certified diabetes educator can help you make an activity plan for the type and frequency of exercise that works for you.  Work with your health care provider to make sure any new activity is safe for you. Also work with your health care provider to adjust your insulin, other medicines, and the food you eat. This information is not intended to replace advice given to you by your health care provider. Make sure you discuss any questions you have with your health care provider. Document Revised: 03/14/2019 Document Reviewed: 03/14/2019 Elsevier Patient Education  [place] Maintenance After Age 32 After age 57, you are at a higher risk for certain long-term diseases and infections as well as injuries from falls. Falls are a major cause of broken bones and head injuries in people who are older than age 45. Getting regular preventive care can help to keep you healthy and well. Preventive care includes getting regular testing and making lifestyle changes as recommended by your health care provider. Talk with your health care provider about:  Which screenings and tests you should have. A screening is a test that checks for a disease when you have no symptoms.  A diet and exercise plan that is right for you. What should I know about screenings and tests to prevent falls? Screening and testing are the best ways to find a health problem early. Early diagnosis and treatment give you the best chance of managing medical conditions that are common after age 94. Certain conditions and lifestyle choices may make you more likely to have a fall. Your health care provider may recommend:  Regular vision checks. Poor vision and conditions such as cataracts can make you more likely to have a fall. If  you wear glasses, make sure to get your prescription updated if your vision changes.  Medicine review. Work with your health care provider to regularly review all of the medicines you are taking, including over-the-counter medicines. Ask your health care provider about any side effects that may make you more likely to have a fall. Tell your health care provider if any medicines that you take make you feel dizzy or sleepy.  Osteoporosis screening. Osteoporosis is a condition that causes the bones to get weaker. This can make the bones weak and cause them to break more easily.  Blood pressure screening. Blood pressure changes and medicines to control blood pressure can make you feel dizzy.  Strength and balance checks. Your health care provider may recommend certain tests to check your strength and balance while standing, walking, or changing positions.  Foot health exam. Foot pain and numbness, as well as not wearing proper footwear, can make you more likely to have a fall.  Depression screening. You may be more likely to have a fall if you have a fear of falling, feel emotionally low, or feel unable to do activities that you used to do.  Alcohol use screening. Using too much alcohol can affect your balance  and may make you more likely to have a fall. What actions can I take to lower my risk of falls? General instructions  Talk with your health care provider about your risks for falling. Tell your health care provider if: ? You fall. Be sure to tell your health care provider about all falls, even ones that seem minor. ? You feel dizzy, sleepy, or off-balance.  Take over-the-counter and prescription medicines only as told by your health care provider. These include any supplements.  Eat a healthy diet and maintain a healthy weight. A healthy diet includes low-fat dairy products, low-fat (lean) meats, and fiber from whole grains, beans, and lots of fruits and vegetables. Home safety  Remove  any tripping hazards, such as rugs, cords, and clutter.  Install safety equipment such as grab bars in bathrooms and safety rails on stairs.  Keep rooms and walkways well-lit. Activity  Follow a regular exercise program to stay fit. This will help you maintain your balance. Ask your health care provider what types of exercise are appropriate for you.  If you need a cane or walker, use it as recommended by your health care provider.  Wear supportive shoes that have nonskid soles.   Lifestyle  Do not drink alcohol if your health care provider tells you not to drink.  If you drink alcohol, limit how much you have: ? 0-1 drink a day for women. ? 0-2 drinks a day for men.  Be aware of how much alcohol is in your drink. In the [place] one drink equals one typical bottle of beer (12 oz), one-half glass of wine (5 oz), or one shot of hard liquor (1 oz).  Do not use any products that contain nicotine or tobacco, such as cigarettes and e-cigarettes. If you need help quitting, ask your health care provider. Summary  Having a healthy lifestyle and getting preventive care can help to protect your health and wellness after age 84.  Screening and testing are the best way to find a health problem early and help you avoid having a fall. Early diagnosis and treatment give you the best chance for managing medical conditions that are more common for people who are older than age 26.  Falls are a major cause of broken bones and head injuries in people who are older than age 67. Take precautions to prevent a fall at home.  Work with your health care provider to learn what changes you can make to improve your health and wellness and to prevent falls. This information is not intended to replace advice given to you by your health care provider. Make sure you discuss any questions you have with your health care provider. Document Revised: 10/07/2018 Document Reviewed: 04/29/2017 Elsevier Patient Education   [place] 65 Years and Older, Male Preventive care refers to lifestyle choices and visits with your health care provider that can promote health and wellness. This includes:  A yearly physical exam. This is also called an annual wellness visit.  Regular dental and eye exams.  Immunizations.  Screening for certain conditions.  Healthy lifestyle choices, such as: ? Eating a healthy diet. ? Getting regular exercise. ? Not using drugs or products that contain nicotine and tobacco. ? Limiting alcohol use. What can I expect for my preventive care visit? Physical exam Your health care provider will check your:  Height and weight. These may be used to calculate your BMI (body mass index). BMI is a measurement that tells if  you are at a healthy weight.  Heart rate and blood pressure.  Body temperature.  Skin for abnormal spots. Counseling Your health care provider may ask you questions about your:  Past medical problems.  Family's medical history.  Alcohol, tobacco, and drug use.  Emotional well-being.  Home life and relationship well-being.  Sexual activity.  Diet, exercise, and sleep habits.  History of falls.  Memory and ability to understand (cognition).  Work and work Statistician.  Access to firearms. What immunizations do I need? Vaccines are usually given at various ages, according to a schedule. Your health care provider will recommend vaccines for you based on your age, medical history, and lifestyle or other factors, such as travel or where you work.   What tests do I need? Blood tests  Lipid and cholesterol levels. These may be checked every 5 years, or more often depending on your overall health.  Hepatitis C test.  Hepatitis B test. Screening  Lung cancer screening. You may have this screening every year starting at age 14 if you have a 30-pack-year history of smoking and currently smoke or have quit within the past 15  years.  Colorectal cancer screening. ? All adults should have this screening starting at age 32 and continuing until age 41. ? Your health care provider may recommend screening at age 35 if you are at increased risk. ? You will have tests every 1-10 years, depending on your results and the type of screening test.  Prostate cancer screening. Recommendations will vary depending on your family history and other risks.  Genital exam to check for testicular cancer or hernias.  Diabetes screening. ? This is done by checking your blood sugar (glucose) after you have not eaten for a while (fasting). ? You may have this done every 1-3 years.  Abdominal aortic aneurysm (AAA) screening. You may need this if you are a current or former smoker.  STD (sexually transmitted disease) testing, if you are at risk. Follow these instructions at home: Eating and drinking  Eat a diet that includes fresh fruits and vegetables, whole grains, lean protein, and low-fat dairy products. Limit your intake of foods with high amounts of sugar, saturated fats, and salt.  Take vitamin and mineral supplements as recommended by your health care provider.  Do not drink alcohol if your health care provider tells you not to drink.  If you drink alcohol: ? Limit how much you have to 0-2 drinks a day. ? Be aware of how much alcohol is in your drink. In the [place] one drink equals one 12 oz bottle of beer (355 mL), one 5 oz glass of wine (148 mL), or one 1 oz glass of hard liquor (44 mL).   Lifestyle  Take daily care of your teeth and gums. Brush your teeth every morning and night with fluoride toothpaste. Floss one time each day.  Stay active. Exercise for at least 30 minutes 5 or more days each week.  Do not use any products that contain nicotine or tobacco, such as cigarettes, e-cigarettes, and chewing tobacco. If you need help quitting, ask your health care provider.  Do not use drugs.  If you are sexually active,  practice safe sex. Use a condom or other form of protection to prevent STIs (sexually transmitted infections).  Talk with your health care provider about taking a low-dose aspirin or statin.  Find healthy ways to cope with stress, such as: ? Meditation, yoga, or listening to music. ? Journaling. ? Talking  to a trusted person. ? Spending time with friends and family. Safety  Always wear your seat belt while driving or riding in a vehicle.  Do not drive: ? If you have been drinking alcohol. Do not ride with someone who has been drinking. ? When you are tired or distracted. ? While texting.  Wear a helmet and other protective equipment during sports activities.  If you have firearms in your house, make sure you follow all gun safety procedures. What's next?  Visit your health care provider once a year for an annual wellness visit.  Ask your health care provider how often you should have your eyes and teeth checked.  Stay up to date on all vaccines. This information is not intended to replace advice given to you by your health care provider. Make sure you discuss any questions you have with your health care provider. Document Revised: 03/15/2019 Document Reviewed: 06/10/2018 Elsevier Patient Education  2021 [employer].

## 2020-10-11 NOTE — Progress Notes (Addendum)
Established Patient Office Visit  Subjective:  Patient ID: Norman Bradley, male    DOB: 1951/06/04  Age: 70 y.o. MRN: [id number]  CC:  Chief Complaint  Patient presents with  . Follow-up    Routine follow up on BP and DM, no concerns. Patient not fasting.     HPI Norman Bradley presents for follow-up of hypertension, diabetes, elevated cholesterol vitamin D D deficiency elevated liver enzymes.  Drinks no more than 2-3 servings of alcohol when he does drink.  Denies daily drinking.  He did drink last night with clients.  Not exercising much.  Up-to-date on health maintenance.  Neurology does not think he has Parkinson's.  Mother suffered from alcoholism.  Past Medical History:  Diagnosis Date  . Diabetes mellitus without complication [facility])     Past Surgical History:  Procedure Laterality Date  . STRABISMUS SURGERY      Family History  Problem Relation Age of Onset  . Diabetes Maternal Grandmother   . Diabetes Maternal Grandfather   . Alcohol abuse Mother   . Cancer Mother   . Depression Mother   . Early death Mother   . Cancer Brother        leukemia  . Hypothyroidism Daughter   . Colon cancer Son     Social History   Socioeconomic History  . Marital status: Married    Spouse name: Not on file  . Number of children: 7  . Years of education: Not on file  . Highest education level: Not on file  Occupational History  . Not on file  Tobacco Use  . Smoking status: Current Some Day Smoker    Types: Cigars  . Smokeless tobacco: Never Used  Vaping Use  . Vaping Use: Never used  Substance and Sexual Activity  . Alcohol use: Yes    Comment: 3-4 glasses per wine once per week  . Drug use: No  . Sexual activity: Yes    Partners: Female  Other Topics Concern  . Not on file  Social History Narrative   left handed   Live with spouse   Social Determinants of Health   Financial Resource Strain: Not on file  Food Insecurity: Not on file   Transportation Needs: Not on file  Physical Activity: Not on file  Stress: Not on file  Social Connections: Not on file  Intimate Partner Violence: Not on file    Outpatient Medications Prior to Visit  Medication Sig Dispense Refill  . metFORMIN (GLUCOPHAGE) 1000 MG tablet TAKE 1 TABLET(1000 MG) BY MOUTH TWICE DAILY WITH A MEAL 180 tablet 0  . pravastatin (PRAVACHOL) 20 MG tablet TAKE 1 TABLET(20 MG) BY MOUTH DAILY 90 tablet 3  . trandolapril (MAVIK) 1 MG tablet TAKE 1 TABLET(1 MG) BY MOUTH DAILY 90 tablet 0   No facility-administered medications prior to visit.    No Known Allergies  ROS Review of Systems  Constitutional: Negative for diaphoresis, fatigue, fever and unexpected weight change.  HENT: Negative.   Eyes: Negative for photophobia and visual disturbance.  Respiratory: Negative.   Cardiovascular: Negative.   Gastrointestinal: Negative.   Endocrine: Negative for polyphagia and polyuria.  Genitourinary: Negative.   Musculoskeletal: Negative for gait problem and joint swelling.  Allergic/Immunologic: Negative for immunocompromised state.  Neurological: Negative for speech difficulty and weakness.  Hematological: Does not bruise/bleed easily.  Psychiatric/Behavioral: Negative.       Objective:    Physical Exam Vitals and nursing note reviewed.  Constitutional:  General: He is not in acute distress.    Appearance: Normal appearance. He is obese. He is not ill-appearing, toxic-appearing or diaphoretic.  HENT:     Head: Normocephalic and atraumatic.     Right Ear: Tympanic membrane, ear canal and external ear normal.     Left Ear: Tympanic membrane, ear canal and external ear normal.     Mouth/Throat:     Mouth: Mucous membranes are moist.     Pharynx: Oropharynx is clear. No oropharyngeal exudate or posterior oropharyngeal erythema.  Eyes:     General: No scleral icterus.       Right eye: No discharge.        Left eye: No discharge.     Extraocular  Movements: Extraocular movements intact.     Conjunctiva/sclera: Conjunctivae normal.     Pupils: Pupils are equal, round, and reactive to light.  Cardiovascular:     Rate and Rhythm: Normal rate and regular rhythm.     Pulses:          Dorsalis pedis pulses are 1+ on the right side and 1+ on the left side.       Posterior tibial pulses are 1+ on the right side and 1+ on the left side.  Pulmonary:     Effort: Pulmonary effort is normal.     Breath sounds: Normal breath sounds.  Abdominal:     General: Abdomen is flat. Bowel sounds are normal. There is no distension.     Palpations: There is no mass.     Tenderness: There is no abdominal tenderness. There is no guarding or rebound.     Hernia: No hernia is present.  Genitourinary:    Prostate: Not enlarged, not tender and no nodules present.     Rectum: Guaiac result negative. No mass, tenderness, anal fissure, external hemorrhoid or internal hemorrhoid. Normal anal tone.  Musculoskeletal:     Cervical back: No rigidity or tenderness.     Right lower leg: No edema.     Left lower leg: No edema.  Lymphadenopathy:     Cervical: No cervical adenopathy.  Skin:    General: Skin is warm and dry.  Neurological:     Mental Status: He is alert and oriented to person, place, and time.  Psychiatric:        Mood and Affect: Mood normal.        Behavior: Behavior normal.    Diabetic Foot Exam - Simple   Simple Foot Form Diabetic Foot exam was performed with the following findings: Yes 10/11/2020  8:42 AM  Visual Inspection No deformities, no ulcerations, no other skin breakdown bilaterally: Yes Sensation Testing Intact to touch and monofilament testing bilaterally: Yes Pulse Check Posterior Tibialis and Dorsalis pulse intact bilaterally: Yes Comments     BP 132/82   Pulse 78   Temp (!) 97.2 F (36.2 C) (Temporal)   Ht 5\' 11"  (1.803 m)   Wt 249 lb 9.6 oz (113.2 kg)   SpO2 96%   BMI 34.81 kg/m  Wt Readings from Last 3  Encounters:  10/11/20 249 lb 9.6 oz (113.2 kg)  10/09/20 250 lb (113.4 kg)  11/04/19 243 lb (110.2 kg)     Health Maintenance Due  Topic Date Due  . TETANUS/TDAP  Never done    There are no preventive care reminders to display for this patient.  Lab Results  Component Value Date   TSH 4.22 10/11/2020   Lab Results  Component Value Date  WBC 5.8 10/11/2020   HGB 16.0 10/11/2020   HCT 47.1 10/11/2020   MCV 89.4 10/11/2020   PLT 144.0 (L) 10/11/2020   Lab Results  Component Value Date   NA 134 (L) 10/11/2020   K 4.3 10/11/2020   CO2 25 10/11/2020   GLUCOSE 156 (H) 10/11/2020   BUN 17 10/11/2020   CREATININE 1.08 10/11/2020   BILITOT 1.6 (H) 10/11/2020   ALKPHOS 53 10/11/2020   AST 80 (H) 10/11/2020   ALT 101 (H) 10/11/2020   PROT 7.3 10/11/2020   ALBUMIN 4.0 10/11/2020   CALCIUM 9.1 10/11/2020   GFR 70.02 10/11/2020   Lab Results  Component Value Date   CHOL 118 10/11/2020   Lab Results  Component Value Date   HDL 28.70 (L) 10/11/2020   Lab Results  Component Value Date   LDLCALC 56 10/17/2019   Lab Results  Component Value Date   TRIG 250.0 (H) 10/11/2020   Lab Results  Component Value Date   CHOLHDL 4 10/11/2020   Lab Results  Component Value Date   HGBA1C 8.0 (H) 10/11/2020      Assessment & Plan:   Problem List Items Addressed This Visit      Cardiovascular and Mediastinum   Essential hypertension - Primary   Relevant Orders   Basic metabolic panel (Completed)   CBC (Completed)     Endocrine   Type 2 diabetes mellitus without complication, without long-term current use of insulin (HCC)   Relevant Orders   Basic metabolic panel (Completed)   Hemoglobin A1c (Completed)   Urinalysis, Routine w reflex microscopic (Completed)   Microalbumin / creatinine urine ratio (Completed)     Other   Healthcare maintenance   Relevant Orders   PSA (Completed)   Elevated LDL cholesterol level   Relevant Orders   Hepatic function panel  (Completed)   Lipid panel (Completed)   LDL cholesterol, direct (Completed)   Tremor of left hand   Relevant Orders   TSH (Completed)   Vitamin D deficiency   Relevant Orders   VITAMIN D 25 Hydroxy (Vit-D Deficiency, Fractures) (Completed)   Elevated TSH   Relevant Orders   TSH (Completed)    Other Visit Diagnoses    Elevated LFTs       Relevant Orders   US Abdomen Complete (Completed)   Ambulatory referral to Gastroenterology      No orders of the defined types were placed in this encounter.  Asked patient to increase his exercise level and try to lose some weight.  Given information on health maintenance and disease prevention for those over 68.  Discussed ordering a liver ultrasound if his LFTs remain elevated. Follow-up: Return in about 6 months (around 04/12/2021).    Libby Maw, MD

## 2020-10-16 ENCOUNTER — Ambulatory Visit (HOSPITAL_BASED_OUTPATIENT_CLINIC_OR_DEPARTMENT_OTHER)
Admission: RE | Admit: 2020-10-16 | Discharge: 2020-10-16 | Disposition: A | Discharge status: Home / Self Care | Payer: Medicare Other | Source: Ambulatory Visit | Attending: Family Medicine | Admitting: Family Medicine

## 2020-10-16 ENCOUNTER — Other Ambulatory Visit: Payer: Self-pay

## 2020-10-16 ENCOUNTER — Encounter: Payer: Self-pay | Admitting: Family Medicine

## 2020-10-16 DIAGNOSIS — R7989 Other specified abnormal findings of blood chemistry: Secondary | ICD-10-CM | POA: Diagnosis not present

## 2020-10-17 ENCOUNTER — Encounter: Payer: Self-pay | Admitting: Family Medicine

## 2020-10-17 DIAGNOSIS — R7989 Other specified abnormal findings of blood chemistry: Secondary | ICD-10-CM

## 2020-10-17 NOTE — Addendum Note (Signed)
Addended by: Jon Billings on: 10/17/2020 11:39 AM   Modules accepted: Orders

## 2020-10-18 ENCOUNTER — Other Ambulatory Visit: Payer: Self-pay

## 2020-10-18 DIAGNOSIS — E119 Type 2 diabetes mellitus without complications: Secondary | ICD-10-CM

## 2020-10-18 DIAGNOSIS — R030 Elevated blood-pressure reading, without diagnosis of hypertension: Secondary | ICD-10-CM

## 2020-10-18 MED ORDER — TRANDOLAPRIL 1 MG PO TABS: ORAL_TABLET | ORAL | 0 refills | Status: DC

## 2020-10-19 ENCOUNTER — Other Ambulatory Visit (INDEPENDENT_AMBULATORY_CARE_PROVIDER_SITE_OTHER): Payer: Medicare Other

## 2020-10-19 ENCOUNTER — Other Ambulatory Visit: Payer: Self-pay

## 2020-10-19 DIAGNOSIS — R7989 Other specified abnormal findings of blood chemistry: Secondary | ICD-10-CM | POA: Diagnosis not present

## 2020-10-19 LAB — HEPATIC FUNCTION PANEL
ALT: 118 U/L — ABNORMAL HIGH (ref 0–53)
AST: 75 U/L — ABNORMAL HIGH (ref 0–37)
Albumin: 4.2 g/dL (ref 3.5–5.2)
Alkaline Phosphatase: 56 U/L (ref 39–117)
Bilirubin, Direct: 0.4 mg/dL — ABNORMAL HIGH (ref 0.0–0.3)
Total Bilirubin: 2.1 mg/dL — ABNORMAL HIGH (ref 0.2–1.2)
Total Protein: 7.4 g/dL (ref 6.0–8.3)

## 2020-10-19 NOTE — Progress Notes (Signed)
Per orders of Dr. Ethelene Hal pt is here for lab draw, pt tolerated draw well.

## 2020-10-23 ENCOUNTER — Encounter: Payer: Self-pay | Admitting: Family Medicine

## 2020-10-23 ENCOUNTER — Telehealth (INDEPENDENT_AMBULATORY_CARE_PROVIDER_SITE_OTHER): Payer: Medicare Other | Admitting: Family Medicine

## 2020-10-23 VITALS — Ht 71.0 in | Wt 249.0 lb

## 2020-10-23 DIAGNOSIS — R7989 Other specified abnormal findings of blood chemistry: Secondary | ICD-10-CM

## 2020-10-23 DIAGNOSIS — K760 Fatty (change of) liver, not elsewhere classified: Secondary | ICD-10-CM

## 2020-10-23 NOTE — Progress Notes (Signed)
Established Patient Office Visit  Subjective:  Patient ID: Norman Bradley, male    DOB: 05/19/1951  Age: 70 y.o. MRN: [id number]  CC:  Chief Complaint  Patient presents with  . Follow-up    Discuss labs ultra sound results.     HPI Norman Bradley presents for follow-up of labs that showed mild increases in his LFTs, direct and total bilirubin.  Ultrasound showed evidence for fatty liver disease.  No gallstones were noted.  Over the last few years patient's triglyceride levels have increased.  He does come fasting for his blood work.  His weight is stayed essentially the same.  He does consume alcohol a few days a week with no more than 2 glasses of wine in the setting.  Hepatitis C was negative in 2018.  He does not take Tylenol.  He does take 3 ibuprofen before bed.  Recently started Glucophage for insulin resistance.  Past Medical History:  Diagnosis Date  . Diabetes mellitus without complication [facility])     Past Surgical History:  Procedure Laterality Date  . STRABISMUS SURGERY      Family History  Problem Relation Age of Onset  . Diabetes Maternal Grandmother   . Diabetes Maternal Grandfather   . Alcohol abuse Mother   . Cancer Mother   . Depression Mother   . Early death Mother   . Cancer Brother        leukemia  . Hypothyroidism Daughter   . Colon cancer Son     Social History   Socioeconomic History  . Marital status: Married    Spouse name: Not on file  . Number of children: 7  . Years of education: Not on file  . Highest education level: Not on file  Occupational History  . Not on file  Tobacco Use  . Smoking status: Current Some Day Smoker    Types: Cigars  . Smokeless tobacco: Never Used  Vaping Use  . Vaping Use: Never used  Substance and Sexual Activity  . Alcohol use: Yes    Comment: 3-4 glasses per wine once per week  . Drug use: No  . Sexual activity: Yes    Partners: Female  Other Topics Concern  . Not on file   Social History Narrative   left handed   Live with spouse   Social Determinants of Health   Financial Resource Strain: Not on file  Food Insecurity: Not on file  Transportation Needs: Not on file  Physical Activity: Not on file  Stress: Not on file  Social Connections: Not on file  Intimate Partner Violence: Not on file    Outpatient Medications Prior to Visit  Medication Sig Dispense Refill  . metFORMIN (GLUCOPHAGE) 1000 MG tablet TAKE 1 TABLET(1000 MG) BY MOUTH TWICE DAILY WITH A MEAL 180 tablet 0  . pravastatin (PRAVACHOL) 20 MG tablet TAKE 1 TABLET(20 MG) BY MOUTH DAILY 90 tablet 3  . trandolapril (MAVIK) 1 MG tablet TAKE 1 TABLET(1 MG) BY MOUTH DAILY 90 tablet 0   No facility-administered medications prior to visit.    No Known Allergies  ROS Review of Systems  Constitutional: Negative.   HENT: Negative.   Eyes: Negative for photophobia and visual disturbance.  Respiratory: Negative.   Cardiovascular: Negative.   Gastrointestinal: Negative.   Psychiatric/Behavioral: Negative.       Objective:    Physical Exam Nursing note reviewed.  Constitutional:      General: He is not in acute distress.  Appearance: Normal appearance. He is obese. He is not ill-appearing or toxic-appearing.  Eyes:     Conjunctiva/sclera: Conjunctivae normal.  Pulmonary:     Effort: Pulmonary effort is normal.  Neurological:     Mental Status: He is alert and oriented to person, place, and time.  Psychiatric:        Mood and Affect: Mood normal.        Behavior: Behavior normal.     Ht 5\' 11"  (1.803 m)   Wt 249 lb (112.9 kg)   BMI 34.73 kg/m  Wt Readings from Last 3 Encounters:  10/23/20 249 lb (112.9 kg)  10/11/20 249 lb 9.6 oz (113.2 kg)  10/09/20 250 lb (113.4 kg)     Health Maintenance Due  Topic Date Due  . TETANUS/TDAP  Never done    There are no preventive care reminders to display for this patient.  Lab Results  Component Value Date   TSH 4.22 10/11/2020    Lab Results  Component Value Date   WBC 5.8 10/11/2020   HGB 16.0 10/11/2020   HCT 47.1 10/11/2020   MCV 89.4 10/11/2020   PLT 144.0 (L) 10/11/2020   Lab Results  Component Value Date   NA 134 (L) 10/11/2020   K 4.3 10/11/2020   CO2 25 10/11/2020   GLUCOSE 156 (H) 10/11/2020   BUN 17 10/11/2020   CREATININE 1.08 10/11/2020   BILITOT 2.1 (H) 10/19/2020   ALKPHOS 56 10/19/2020   AST 75 (H) 10/19/2020   ALT 118 (H) 10/19/2020   PROT 7.4 10/19/2020   ALBUMIN 4.2 10/19/2020   CALCIUM 9.1 10/11/2020   GFR 70.02 10/11/2020   Lab Results  Component Value Date   CHOL 118 10/11/2020   Lab Results  Component Value Date   HDL 28.70 (L) 10/11/2020   Lab Results  Component Value Date   LDLCALC 56 10/17/2019   Lab Results  Component Value Date   TRIG 250.0 (H) 10/11/2020   Lab Results  Component Value Date   CHOLHDL 4 10/11/2020   Lab Results  Component Value Date   HGBA1C 8.0 (H) 10/11/2020      Assessment & Plan:   Problem List Items Addressed This Visit   None   Visit Diagnoses    Hepatic steatosis    -  Primary   Elevated LFTs          No orders of the defined types were placed in this encounter.   Follow-up: No follow-ups on file.  At this point the treatment would be losing weight and decreasing the amount of fat in his diet.  His alcohol intake does not sound as though it is part of this problem.  Consultation for second opinion is pending  Virtual Visit via Video Note  I connected with Norman Bradley on 10/23/20 at 11:30 AM EDT by a video enabled telemedicine application and verified that I am speaking with the correct person using two identifiers.  Location: Patient: work  Provider:clinic   I discussed the limitations of evaluation and management by telemedicine and the availability of in person appointments. The patient expressed understanding and agreed to proceed.  History of Present Illness:     Observations/Objective:   Assessment and Plan:   Follow Up Instructions:    I discussed the assessment and treatment plan with the patient. The patient was provided an opportunity to ask questions and all were answered. The patient agreed with the plan and demonstrated an understanding of the instructions.  The patient was advised to call back or seek an in-person evaluation if the symptoms worsen or if the condition fails to improve as anticipated.  I provided 25 minutes of non-face-to-face time during this encounter.   Libby Maw, MD   Libby Maw, MD

## 2020-11-16 ENCOUNTER — Other Ambulatory Visit: Payer: Self-pay | Admitting: Family Medicine

## 2020-11-16 DIAGNOSIS — E119 Type 2 diabetes mellitus without complications: Secondary | ICD-10-CM

## 2020-11-16 NOTE — Telephone Encounter (Signed)
Last VV 10/23/20 Last fill 09/18/20  #180/0

## 2020-11-28 ENCOUNTER — Telehealth: Payer: Self-pay | Admitting: Family Medicine

## 2020-11-28 NOTE — Telephone Encounter (Signed)
Pt called in wanting his provider to know when he sits in a chair. He can not touch his big toe to the floor. He feels he has drop foot. I offered an appointment, he wanted a phone message put in. He is wanting a cb to go over what is happening. Please advise pt at [phone].

## 2020-11-28 NOTE — Telephone Encounter (Signed)
Left message for patient to call back and schedule Medicare Annual Wellness Visit (AWV).   Please offer to do virtually or by telephone.   Due for AWVI  Please schedule at anytime with Nurse Health Advisor.   

## 2020-11-29 NOTE — Telephone Encounter (Signed)
Returned patients call, per patient he had a fall 2 days ago not sure if he fell wrong and hit a nerve in his left leg or what but now patient is not able to pick up his left leg or even tap his toes on left foot when he's sitting. There is no pains in the foot it will cramp from time to time. Appointment offered at time of call yesterday but patient declined. Today appointments booked and patient will be heading out of town next week. Patient wanting to ask Dr. Ethelene Hal if he feels like this should be checked. Please advise.

## 2020-11-29 NOTE — Telephone Encounter (Signed)
Need to see him. Doesn't sound like dropped foot to me.

## 2020-11-29 NOTE — Telephone Encounter (Signed)
Appointment scheduled for when patient is available to come in. Advised if symptoms become worse before scheduled appointment to go to urgent care or ED. Patient agrees.

## 2020-12-10 ENCOUNTER — Ambulatory Visit: Payer: Medicare Other | Admitting: Family Medicine

## 2020-12-10 DIAGNOSIS — Z0289 Encounter for other administrative examinations: Secondary | ICD-10-CM

## 2020-12-11 ENCOUNTER — Ambulatory Visit: Payer: Medicare Other

## 2020-12-11 ENCOUNTER — Telehealth: Payer: Self-pay | Admitting: *Deleted

## 2020-12-11 NOTE — Telephone Encounter (Signed)
Unable able to reach patient for Annual Wellness Tele health visit.  Made 3 attempts.

## 2020-12-25 DIAGNOSIS — M67912 Unspecified disorder of synovium and tendon, left shoulder: Secondary | ICD-10-CM | POA: Diagnosis not present

## 2021-01-02 DIAGNOSIS — M25612 Stiffness of left shoulder, not elsewhere classified: Secondary | ICD-10-CM | POA: Diagnosis not present

## 2021-01-14 ENCOUNTER — Other Ambulatory Visit: Payer: Self-pay | Admitting: Family Medicine

## 2021-01-14 DIAGNOSIS — R030 Elevated blood-pressure reading, without diagnosis of hypertension: Secondary | ICD-10-CM

## 2021-01-14 DIAGNOSIS — E119 Type 2 diabetes mellitus without complications: Secondary | ICD-10-CM

## 2021-01-18 ENCOUNTER — Other Ambulatory Visit: Payer: Self-pay | Admitting: Family Medicine

## 2021-01-18 DIAGNOSIS — R030 Elevated blood-pressure reading, without diagnosis of hypertension: Secondary | ICD-10-CM

## 2021-01-18 DIAGNOSIS — E119 Type 2 diabetes mellitus without complications: Secondary | ICD-10-CM

## 2021-03-23 ENCOUNTER — Telehealth: Payer: Self-pay

## 2021-03-23 NOTE — Telephone Encounter (Signed)
Spoke to patient and he declined an AWV. Dm/cma

## 2021-04-12 ENCOUNTER — Ambulatory Visit: Payer: Medicare Other | Admitting: Family Medicine

## 2021-04-18 ENCOUNTER — Other Ambulatory Visit: Payer: Self-pay

## 2021-04-19 ENCOUNTER — Ambulatory Visit (INDEPENDENT_AMBULATORY_CARE_PROVIDER_SITE_OTHER): Payer: Medicare Other | Admitting: Family Medicine

## 2021-04-19 ENCOUNTER — Encounter: Payer: Self-pay | Admitting: Family Medicine

## 2021-04-19 VITALS — BP 126/70 | HR 85 | Temp 97.0°F | Ht 71.0 in | Wt 241.8 lb

## 2021-04-19 DIAGNOSIS — Z23 Encounter for immunization: Secondary | ICD-10-CM

## 2021-04-19 DIAGNOSIS — R7989 Other specified abnormal findings of blood chemistry: Secondary | ICD-10-CM

## 2021-04-19 DIAGNOSIS — E119 Type 2 diabetes mellitus without complications: Secondary | ICD-10-CM

## 2021-04-19 DIAGNOSIS — I10 Essential (primary) hypertension: Secondary | ICD-10-CM | POA: Diagnosis not present

## 2021-04-19 DIAGNOSIS — R946 Abnormal results of thyroid function studies: Secondary | ICD-10-CM | POA: Diagnosis not present

## 2021-04-19 DIAGNOSIS — E7800 Pure hypercholesterolemia, unspecified: Secondary | ICD-10-CM

## 2021-04-19 LAB — BASIC METABOLIC PANEL
BUN: 21 mg/dL (ref 6–23)
CO2: 28 mEq/L (ref 19–32)
Calcium: 9.4 mg/dL (ref 8.4–10.5)
Chloride: 100 mEq/L (ref 96–112)
Creatinine, Ser: 1.15 mg/dL (ref 0.40–1.50)
GFR: 64.7 mL/min (ref >60.00)
Glucose, Bld: 211 mg/dL — ABNORMAL HIGH (ref 70–99)
Potassium: 4.1 mEq/L (ref 3.5–5.1)
Sodium: 137 mEq/L (ref 135–145)

## 2021-04-19 LAB — HEPATIC FUNCTION PANEL
ALT: 94 U/L — ABNORMAL HIGH (ref 0–53)
AST: 57 U/L — ABNORMAL HIGH (ref 0–37)
Albumin: 4.5 g/dL (ref 3.5–5.2)
Alkaline Phosphatase: 66 U/L (ref 39–117)
Bilirubin, Direct: 0.3 mg/dL (ref 0.0–0.3)
Total Bilirubin: 1.4 mg/dL — ABNORMAL HIGH (ref 0.2–1.2)
Total Protein: 7.6 g/dL (ref 6.0–8.3)

## 2021-04-19 LAB — URINALYSIS, ROUTINE W REFLEX MICROSCOPIC
Bilirubin Urine: NEGATIVE
Hgb urine dipstick: NEGATIVE
Leukocytes,Ua: NEGATIVE
Nitrite: NEGATIVE
Specific Gravity, Urine: >=1.03 — AB (ref 1.000–1.030)
Total Protein, Urine: NEGATIVE
Urine Glucose: 100 — AB
Urobilinogen, UA: 4 — AB (ref 0.0–1.0)
pH: 5.5 (ref 5.0–8.0)

## 2021-04-19 LAB — CBC
HCT: 49.1 % (ref 39.0–52.0)
Hemoglobin: 16.7 g/dL (ref 13.0–17.0)
MCHC: 33.9 g/dL (ref 30.0–36.0)
MCV: 91.5 fl (ref 78.0–100.0)
Platelets: 158 10*3/uL (ref 150.0–400.0)
RBC: 5.37 Mil/uL (ref 4.22–5.81)
RDW: 13.4 % (ref 11.5–15.5)
WBC: 6.3 10*3/uL (ref 4.0–10.5)

## 2021-04-19 LAB — LIPID PANEL
Cholesterol: 134 mg/dL (ref 0–200)
HDL: 32.9 mg/dL — ABNORMAL LOW (ref >39.00)
NonHDL: 100.65
Total CHOL/HDL Ratio: 4
Triglycerides: 226 mg/dL — ABNORMAL HIGH (ref 0.0–149.0)
VLDL: 45.2 mg/dL — ABNORMAL HIGH (ref 0.0–40.0)

## 2021-04-19 LAB — LDL CHOLESTEROL, DIRECT: Direct LDL: 72 mg/dL

## 2021-04-19 LAB — HEMOGLOBIN A1C: Hgb A1c MFr Bld: 8 % — ABNORMAL HIGH (ref 4.6–6.5)

## 2021-04-19 NOTE — Progress Notes (Addendum)
Established Patient Office Visit  Subjective:  Patient ID: Norman Bradley, male    DOB: Nov 14, 1950  Age: 70 y.o. MRN: [id number]  CC:  Chief Complaint  Patient presents with   Follow-up    6 month follow up, no concerns.     HPI Norman Bradley presents for follow-up of hypertension, elevated cholesterol, diabetes fatty liver disease and obesity.  Doing well with all of his medications is having no issues taking them.  Planning a trip to see his son in [place].  Past Medical History:  Diagnosis Date   Diabetes mellitus without complication ([place])     Past Surgical History:  Procedure Laterality Date   STRABISMUS SURGERY      Family History  Problem Relation Age of Onset   Diabetes Maternal Grandmother    Diabetes Maternal Grandfather    Alcohol abuse Mother    Cancer Mother    Depression Mother    Early death Mother    Cancer Brother        leukemia   Hypothyroidism Daughter    Colon cancer Son     Social History   Socioeconomic History   Marital status: Married    Spouse name: Not on file   Number of children: 7   Years of education: Not on file   Highest education level: Not on file  Occupational History   Not on file  Tobacco Use   Smoking status: Some Days    Types: Cigars   Smokeless tobacco: Never  Vaping Use   Vaping Use: Never used  Substance and Sexual Activity   Alcohol use: Yes    Comment: 3-4 glasses per wine once per week   Drug use: No   Sexual activity: Yes    Partners: Female  Other Topics Concern   Not on file  Social History Narrative   left handed   Live with spouse   Social Determinants of Health   Financial Resource Strain: Not on file  Food Insecurity: Not on file  Transportation Needs: Not on file  Physical Activity: Not on file  Stress: Not on file  Social Connections: Not on file  Intimate Partner Violence: Not on file    Outpatient Medications Prior to Visit  Medication Sig Dispense Refill    metFORMIN (GLUCOPHAGE) 1000 MG tablet TAKE 1 TABLET(1000 MG) BY MOUTH TWICE DAILY WITH A MEAL 180 tablet 0   pravastatin (PRAVACHOL) 20 MG tablet TAKE 1 TABLET(20 MG) BY MOUTH DAILY 90 tablet 3   trandolapril (MAVIK) 1 MG tablet TAKE 1 TABLET(1 MG) BY MOUTH DAILY 90 tablet 0   No facility-administered medications prior to visit.    No Known Allergies  ROS Review of Systems  Constitutional:  Negative for diaphoresis, fatigue, fever and unexpected weight change.  HENT: Negative.    Eyes:  Negative for photophobia and visual disturbance.  Respiratory: Negative.    Cardiovascular: Negative.   Gastrointestinal: Negative.   Endocrine: Negative for polyphagia and polyuria.  Genitourinary: Negative.   Musculoskeletal:  Negative for gait problem and joint swelling.  Skin:  Negative for pallor and rash.  Neurological:  Negative for speech difficulty and weakness.  Psychiatric/Behavioral: Negative.       Objective:    Physical Exam Vitals and nursing note reviewed.  Constitutional:      General: He is not in acute distress.    Appearance: Normal appearance. He is obese. He is not ill-appearing, toxic-appearing or diaphoretic.  HENT:     Head:  Normocephalic and atraumatic.     Right Ear: Tympanic membrane, ear canal and external ear normal.     Left Ear: Tympanic membrane, ear canal and external ear normal.     Mouth/Throat:     Mouth: Mucous membranes are moist.     Pharynx: Oropharynx is clear. No oropharyngeal exudate or posterior oropharyngeal erythema.  Eyes:     General: No scleral icterus.       Right eye: No discharge.        Left eye: No discharge.     Extraocular Movements: Extraocular movements intact.     Conjunctiva/sclera: Conjunctivae normal.     Pupils: Pupils are equal, round, and reactive to light.  Neck:     Vascular: No carotid bruit.  Cardiovascular:     Rate and Rhythm: Normal rate and regular rhythm.  Pulmonary:     Effort: Pulmonary effort is normal.      Breath sounds: Normal breath sounds.  Abdominal:     General: Bowel sounds are normal.  Musculoskeletal:     Cervical back: No rigidity or tenderness.  Lymphadenopathy:     Cervical: No cervical adenopathy.  Skin:    General: Skin is warm and dry.  Neurological:     Mental Status: He is alert and oriented to person, place, and time.  Psychiatric:        Mood and Affect: Mood normal.        Behavior: Behavior normal.    BP 126/70 (BP Location: Right Arm, Patient Position: Sitting, Cuff Size: Normal)   Pulse 85   Temp (!) 97 F (36.1 C) (Temporal)   Ht 5\' 11"  (1.803 m)   Wt 241 lb 12.8 oz (109.7 kg)   SpO2 97%   BMI 33.72 kg/m  Wt Readings from Last 3 Encounters:  04/19/21 241 lb 12.8 oz (109.7 kg)  10/23/20 249 lb (112.9 kg)  10/11/20 249 lb 9.6 oz (113.2 kg)     Health Maintenance Due  Topic Date Due   Zoster Vaccines- Shingrix (1 of 2) Never done   OPHTHALMOLOGY EXAM  03/21/2021    There are no preventive care reminders to display for this patient.  Lab Results  Component Value Date   TSH 3.21 04/19/2021   Lab Results  Component Value Date   WBC 6.3 04/19/2021   HGB 16.7 04/19/2021   HCT 49.1 04/19/2021   MCV 91.5 04/19/2021   PLT 158.0 04/19/2021   Lab Results  Component Value Date   NA 137 04/19/2021   K 4.1 04/19/2021   CO2 28 04/19/2021   GLUCOSE 211 (H) 04/19/2021   BUN 21 04/19/2021   CREATININE 1.15 04/19/2021   BILITOT 1.4 (H) 04/19/2021   ALKPHOS 66 04/19/2021   AST 57 (H) 04/19/2021   ALT 94 (H) 04/19/2021   PROT 7.6 04/19/2021   ALBUMIN 4.5 04/19/2021   CALCIUM 9.4 04/19/2021   GFR 64.70 04/19/2021   Lab Results  Component Value Date   CHOL 134 04/19/2021   Lab Results  Component Value Date   HDL 32.90 (L) 04/19/2021   Lab Results  Component Value Date   LDLCALC 56 10/17/2019   Lab Results  Component Value Date   TRIG 226.0 (H) 04/19/2021   Lab Results  Component Value Date   CHOLHDL 4 04/19/2021   Lab Results   Component Value Date   HGBA1C 8.0 (H) 04/19/2021      Assessment & Plan:   Problem List Items Addressed This Visit  Cardiovascular and Mediastinum   Essential hypertension   Relevant Orders   Basic metabolic panel (Completed)   CBC (Completed)   Urinalysis, Routine w reflex microscopic (Completed)     Endocrine   Type 2 diabetes mellitus without complication, without long-term current use of insulin (HCC)   Relevant Medications   Semaglutide (RYBELSUS) 3 MG TABS   Other Relevant Orders   Hemoglobin A1c (Completed)   Urinalysis, Routine w reflex microscopic (Completed)     Other   Elevated LDL cholesterol level   Relevant Orders   Lipid panel (Completed)   Elevated LFTs - Primary   Relevant Orders   Hepatic function panel (Completed)   Elevated TSH   Relevant Orders   TSH (Completed)   Other Visit Diagnoses     Need for influenza vaccination       Relevant Orders   Flu Vaccine QUAD High Dose(Fluad) (Completed)   Need for Tdap vaccination       Relevant Orders   Tdap vaccine greater than or equal to 7yo IM (Completed)       Meds ordered this encounter  Medications   Semaglutide (RYBELSUS) 3 MG TABS    Sig: Take 1 tablet by mouth daily.    Dispense:  30 tablet    Refill:  5     Follow-up: Return in about 6 months (around 10/18/2021).  We discussed adding Rybelsus for diabetes control.  Encouraged weight loss for overall health and improved blood pressure and diabetes control as well as treatment of fatty liver disease.  Information was given on Rybelsus and fatty liver disease.  Libby Maw, MD  10/24 addendum:.  Have added low-dose Rybelsus for better diabetes control.  He was given information about this medication along with fatty liver disease

## 2021-04-22 ENCOUNTER — Other Ambulatory Visit: Payer: Self-pay | Admitting: Family Medicine

## 2021-04-22 DIAGNOSIS — E119 Type 2 diabetes mellitus without complications: Secondary | ICD-10-CM

## 2021-04-22 LAB — TSH: TSH: 3.21 u[IU]/mL (ref 0.35–5.50)

## 2021-04-22 MED ORDER — RYBELSUS 3 MG PO TABS: 1.0000 | ORAL_TABLET | Freq: Every day | ORAL | 5 refills | Status: DC

## 2021-04-22 NOTE — Addendum Note (Signed)
Addended by: Jon Billings on: 04/22/2021 09:16 AM   Modules accepted: Orders

## 2021-04-24 ENCOUNTER — Other Ambulatory Visit: Payer: Self-pay | Admitting: Family Medicine

## 2021-04-24 DIAGNOSIS — R030 Elevated blood-pressure reading, without diagnosis of hypertension: Secondary | ICD-10-CM

## 2021-04-24 DIAGNOSIS — E119 Type 2 diabetes mellitus without complications: Secondary | ICD-10-CM

## 2021-05-10 ENCOUNTER — Encounter: Payer: Self-pay | Admitting: Family Medicine

## 2021-06-04 NOTE — Telephone Encounter (Signed)
Please advise message below, patient not sure if he should currently be on 5mg  of rebylsis or the 3mg  that he picked up from the pharmacy.

## 2021-07-09 ENCOUNTER — Telehealth: Payer: Self-pay

## 2021-07-09 ENCOUNTER — Ambulatory Visit: Payer: Medicare Other

## 2021-07-09 NOTE — Telephone Encounter (Signed)
Called patient x 3 with no answer , automatically rolls to Vm , states VM is full.  Patient may reschedule for next available appointment.  L.Tyjon Bowen,LPN

## 2021-07-31 ENCOUNTER — Encounter: Payer: Self-pay | Admitting: Family Medicine

## 2021-07-31 DIAGNOSIS — E119 Type 2 diabetes mellitus without complications: Secondary | ICD-10-CM

## 2021-07-31 MED ORDER — RYBELSUS 7 MG PO TABS: 7.0000 mg | ORAL_TABLET | Freq: Every day | ORAL | 2 refills | Status: DC

## 2021-10-17 ENCOUNTER — Other Ambulatory Visit: Payer: Self-pay | Admitting: Family Medicine

## 2021-10-17 DIAGNOSIS — E119 Type 2 diabetes mellitus without complications: Secondary | ICD-10-CM

## 2021-10-17 DIAGNOSIS — E7800 Pure hypercholesterolemia, unspecified: Secondary | ICD-10-CM

## 2021-10-18 ENCOUNTER — Telehealth: Payer: Self-pay | Admitting: Family Medicine

## 2021-10-18 ENCOUNTER — Ambulatory Visit: Payer: Medicare Other | Admitting: Family Medicine

## 2021-10-18 NOTE — Telephone Encounter (Signed)
No show letter meiled 4/21 ?

## 2021-10-24 LAB — HM DIABETES EYE EXAM

## 2021-10-25 ENCOUNTER — Other Ambulatory Visit: Payer: Self-pay | Admitting: Family Medicine

## 2021-10-25 DIAGNOSIS — E119 Type 2 diabetes mellitus without complications: Secondary | ICD-10-CM

## 2021-10-28 ENCOUNTER — Telehealth: Payer: Self-pay | Admitting: Family Medicine

## 2021-10-28 NOTE — Telephone Encounter (Signed)
Left message for patient to call back and schedule Medicare Annual Wellness Visit (AWV). Please offer to do virtually or by telephone.  Left office number and my jabber #[phone]. ? ?Due for AWVI ? ?Please schedule at anytime with Nurse Health Advisor. ?  ?

## 2021-11-05 NOTE — Telephone Encounter (Signed)
2nd no show, letter sent, fee generated ?

## 2021-11-15 ENCOUNTER — Telehealth: Payer: Self-pay | Admitting: Family Medicine

## 2021-11-15 NOTE — Telephone Encounter (Signed)
Left message for patient to call back and schedule Medicare Annual Wellness Visit (AWV) in office.  ° °If not able to come in office, please offer to do virtually or by telephone.  Left office number and my jabber #[phone]. ° °Due for AWVI ° °Please schedule at anytime with Nurse Health Advisor. °  °

## 2021-12-02 ENCOUNTER — Telehealth: Payer: Self-pay | Admitting: Family Medicine

## 2021-12-02 NOTE — Telephone Encounter (Signed)
Left message for patient to call back and schedule Medicare Annual Wellness Visit (AWV).   Please offer to do virtually or by telephone.  Left office number and my jabber #[phone].  AWVI eligible as of 03/30/2017   Please schedule at anytime with Nurse Health Advisor.   

## 2021-12-24 ENCOUNTER — Encounter: Payer: Self-pay | Admitting: Family Medicine

## 2021-12-24 DIAGNOSIS — E119 Type 2 diabetes mellitus without complications: Secondary | ICD-10-CM | POA: Diagnosis not present

## 2021-12-24 DIAGNOSIS — H35342 Macular cyst, hole, or pseudohole, left eye: Secondary | ICD-10-CM | POA: Diagnosis not present

## 2021-12-24 DIAGNOSIS — H2513 Age-related nuclear cataract, bilateral: Secondary | ICD-10-CM | POA: Diagnosis not present

## 2021-12-24 DIAGNOSIS — H25013 Cortical age-related cataract, bilateral: Secondary | ICD-10-CM | POA: Diagnosis not present

## 2022-01-16 ENCOUNTER — Other Ambulatory Visit: Payer: Self-pay | Admitting: Family Medicine

## 2022-01-16 DIAGNOSIS — R030 Elevated blood-pressure reading, without diagnosis of hypertension: Secondary | ICD-10-CM

## 2022-01-16 DIAGNOSIS — E119 Type 2 diabetes mellitus without complications: Secondary | ICD-10-CM

## 2022-01-20 ENCOUNTER — Other Ambulatory Visit: Payer: Self-pay | Admitting: Family Medicine

## 2022-01-20 DIAGNOSIS — E119 Type 2 diabetes mellitus without complications: Secondary | ICD-10-CM

## 2022-01-22 ENCOUNTER — Telehealth: Payer: Self-pay | Admitting: Family Medicine

## 2022-01-22 NOTE — Telephone Encounter (Signed)
Left message for patient to call back and schedule Medicare Annual Wellness Visit (AWV).   Please offer to do virtually or by telephone.  Left office number and my jabber [phone].  AWVI eligible as of 03/30/2017   Please schedule at anytime with Nurse Health Advisor.

## 2022-02-24 DIAGNOSIS — H25013 Cortical age-related cataract, bilateral: Secondary | ICD-10-CM | POA: Diagnosis not present

## 2022-02-24 DIAGNOSIS — H2513 Age-related nuclear cataract, bilateral: Secondary | ICD-10-CM | POA: Diagnosis not present

## 2022-02-24 DIAGNOSIS — H35342 Macular cyst, hole, or pseudohole, left eye: Secondary | ICD-10-CM | POA: Diagnosis not present

## 2022-03-04 ENCOUNTER — Ambulatory Visit (INDEPENDENT_AMBULATORY_CARE_PROVIDER_SITE_OTHER): Payer: Medicare Other

## 2022-03-04 VITALS — Ht 70.0 in | Wt 220.0 lb

## 2022-03-04 DIAGNOSIS — Z0000 Encounter for general adult medical examination without abnormal findings: Secondary | ICD-10-CM | POA: Diagnosis not present

## 2022-03-04 NOTE — Progress Notes (Signed)
Subjective:   Norman Bradley is a 71 y.o. male who presents for Medicare Annual/Subsequent preventive examination.   Virtual Visit via Telephone Note  I connected with  Norman Bradley on 03/04/22 at  8:15 AM EDT by telephone and verified that I am speaking with the correct person using two identifiers.  Location: Patient: home  Provider: [place]  Persons participating in the virtual visit: patient/Nurse Health Advisor   I discussed the limitations, risks, security and privacy concerns of performing an evaluation and management service by telephone and the availability of in person appointments. The patient expressed understanding and agreed to proceed.  Interactive audio and video telecommunications were attempted between this nurse and patient, however failed, due to patient having technical difficulties OR patient did not have access to video capability.  We continued and completed visit with audio only.  Some vital signs may be absent or patient reported.   Daphane Shepherd, LPN  Review of Systems     Cardiac Risk Factors include: advanced age (>43mn, >>65women);diabetes mellitus;hypertension;male gender     Objective:    Today's Vitals   03/04/22 0822  Weight: 220 lb (99.8 kg)  Height: '5\' 10"'$  (1.778 m)   Body mass index is 31.57 kg/m.     03/04/2022    8:27 AM 10/09/2020    1:00 PM 11/04/2019    9:40 AM  Advanced Directives  Does Patient Have a Medical Advance Directive? Yes Yes Yes  Type of AParamedicof AGastonLiving will [place]ill[place] Hoyos[place] will   Copy of [place] Chart? No - copy requested      Current Medications (verified) Outpatient Encounter Medications as of 03/04/2022  Medication Sig   metFORMIN (GLUCOPHAGE) 1000 MG tablet TAKE 1 TABLET(1000 MG) BY MOUTH TWICE DAILY WITH A MEAL   pravastatin (PRAVACHOL) 20 MG tablet TAKE 1 TABLET(20 MG) BY MOUTH DAILY   RYBELSUS 7 MG  TABS TAKE 1 TABLET BY MOUTH DAILY   trandolapril (MAVIK) 1 MG tablet TAKE 1 TABLET(1 MG) BY MOUTH DAILY   No facility-administered encounter medications on file as of 03/04/2022.    Allergies (verified) Patient has no known allergies.   History: Past Medical History:  Diagnosis Date   Diabetes mellitus without complication ([place]    Past Surgical History:  Procedure Laterality Date   STRABISMUS SURGERY     Family History  Problem Relation Age of Onset   Diabetes Maternal Grandmother    Diabetes Maternal Grandfather    Alcohol abuse Mother    Cancer Mother    Depression Mother    Early death Mother    Cancer Brother        leukemia   Hypothyroidism Daughter    Colon cancer Son    Social History   Socioeconomic History   Marital status: Married    Spouse name: Not on file   Number of children: 7   Years of education: Not on file   Highest education level: Not on file  Occupational History   Not on file  Tobacco Use   Smoking status: Some Days    Types: Cigars   Smokeless tobacco: Never  Vaping Use   Vaping Use: Never used  Substance and Sexual Activity   Alcohol use: Yes    Comment: 3-4 glasses per wine once per week   Drug use: No   Sexual activity: Yes    Partners: Female  Other Topics Concern   Not on  file  Social History Narrative   left handed   Live with spouse   Social Determinants of Health   Financial Resource Strain: Low Risk  (03/04/2022)   Overall Financial Resource Strain (CARDIA)    Difficulty of Paying Living Expenses: Not hard at all  Food Insecurity: No Food Insecurity (03/04/2022)   Hunger Vital Sign    Worried About Running Out of Food in the Last Year: Never true    Ran Out of Food in the Last Year: Never true  Transportation Needs: Not on file  Physical Activity: Insufficiently Active (03/04/2022)   Exercise Vital Sign    Days of Exercise per Week: 2 days    Minutes of Exercise per Session: 40 min  Stress: No Stress Concern Present  (03/04/2022)   [place]    Feeling of Stress : Not at all  Social Connections: Moderately Integrated (03/04/2022)   Social Connection and Isolation Panel [NHANES]    Frequency of Communication with Friends and Family: Three times a week    Frequency of Social Gatherings with Friends and Family: More than three times a week    Attends Religious Services: More than 4 times per year    Active Member of [employer] or Organizations: No    Attends Music therapist: Never    Marital Status: Married    Tobacco Counseling Ready to quit: Not Answered Counseling given: Not Answered   Clinical Intake:  Pre-visit preparation completed: Yes  Pain : No/denies pain     Nutritional Risks: None Diabetes: No  How often do you need to have someone help you when you read instructions, pamphlets, or other written materials from your doctor or pharmacy?: 1 - Never What is the last grade level you completed in school?: college  Diabetic?yes Nutrition Risk Assessment:  Has the patient had any N/V/D within the last 2 months?  No  Does the patient have any non-healing wounds?  No  Has the patient had any unintentional weight loss or weight gain?  No   Diabetes:  Is the patient diabetic?  Yes  If diabetic, was a CBG obtained today?  No  Did the patient bring in their glucometer from home?  No  How often do you monitor your CBG's? Once a week .   Financial Strains and Diabetes Management:  Are you having any financial strains with the device, your supplies or your medication? No .  Does the patient want to be seen by Chronic Care Management for management of their diabetes?  No  Would the patient like to be referred to a Nutritionist or for Diabetic Management?  No   Diabetic Exams:  Diabetic Eye Exam: Completed 07/20233 Diabetic Foot Exam: Overdue, Pt has been advised about the importance in completing this exam. Pt is  scheduled for diabetic foot exam on next office viait .   Interpreter Needed?: No  Information entered by :: Jadene Pierini , LPN   Activities of Daily Living    03/04/2022    8:26 AM  In your present state of health, do you have any difficulty performing the following activities:  Hearing? 0  Vision? 0  Difficulty concentrating or making decisions? 0  Walking or climbing stairs? 0  Dressing or bathing? 0  Doing errands, shopping? 0  Preparing Food and eating ? N  Using the Toilet? N  In the past six months, have you accidently leaked urine? N  Do you have problems  with loss of bowel control? N  Managing your Medications? N  Managing your Finances? N  Housekeeping or managing your Housekeeping? N    Patient Care Team: Libby Maw, MD as PCP - General (Family Medicine) Germaine Pomfret, [facility] as Pharmacist (Pharmacist)  Indicate any recent Medical Services you may have received from other than [facility] providers in the past year (date may be approximate).     Assessment:   This is a routine wellness examination for [place].  Hearing/Vision screen Vision Screening - Comments:: Annual ey exams wears glasses   Dietary issues and exercise activities discussed:     Goals Addressed             This Visit's Progress    Exercise 3x per week (30 min per time)         Depression Screen    03/04/2022    8:26 AM 03/04/2022    8:25 AM 03/04/2022    8:24 AM 04/19/2021    8:43 AM 10/23/2020   11:32 AM 10/11/2020    8:14 AM 11/04/2019    9:40 AM  PHQ 2/9 Scores  PHQ - 2 Score 0 0 0 0 0 0 0    Fall Risk    03/04/2022    8:23 AM 04/19/2021    8:43 AM 10/23/2020   11:32 AM 10/11/2020    8:14 AM 10/09/2020    1:00 PM  [place] in the past year? 0 0 0 0 0  Number falls in past yr: 0 0   0  Injury with Fall? 0    0  Risk for fall due to : No Fall Risks      Follow up Falls prevention discussed        [place]:  Any stairs  in or around the home? Yes  If so, are there any without handrails? No  Home free of loose throw rugs in walkways, pet beds, electrical cords, etc? Yes  Adequate lighting in your home to reduce risk of falls? Yes   ASSISTIVE DEVICES UTILIZED TO PREVENT FALLS:  Life alert? No  Use of a cane, walker or w/c? No  Grab bars in the bathroom? No  Shower chair or bench in shower? No  Elevated toilet seat or a handicapped toilet? No   Tn:        03/04/2022    8:32 AM 03/04/2022    8:31 AM  6CIT Screen  What Year? 0 points 0 points  What month? 0 points 0 points  What time? 0 points 0 points  Count back from 20 0 points 0 points  Months in reverse 0 points 0 points  Repeat phrase 0 points 0 points  Total Score 0 points 0 points    Immunizations Immunization History  Administered Date(s) Administered   Fluad Quad(high Dose 65+) 03/22/2019, 04/19/2021   Influenza, Quadrivalent, Recombinant, Inj, Pf 04/15/2018   Influenza-Unspecified 04/21/2017   Moderna Sars-Covid-2 Vaccination 07/29/2019, 08/26/2019, 05/29/2020, 04/12/2021   Pneumococcal Conjugate-13 06/17/2018   Pneumococcal Polysaccharide-23 06/17/2017   Tdap 04/19/2021    TDAP status: Up to date  Flu Vaccine status: Up to date  Pneumococcal vaccine status: Up to date  Covid-19 vaccine status: Completed vaccines  Qualifies for Shingles Vaccine? Yes   Zostavax completed No   Shingrix Completed?: No.    Education has been provided regarding the importance of this vaccine. Patient has been advised to call insurance company to determine  out of pocket expense if they have not yet received this vaccine. Advised may also receive vaccine at local pharmacy or Health Dept. Verbalized acceptance and understanding.  Screening Tests Health Maintenance  Topic Date Due   Zoster Vaccines- Shingrix (1 of 2) Never done   COVID-19 Vaccine (5 - Moderna series) 06/07/2021   COLONOSCOPY (Pts 45-61yr Insurance coverage will need to be  confirmed)  06/30/2021   FOOT EXAM  10/11/2021   HEMOGLOBIN A1C  10/18/2021   INFLUENZA VACCINE  01/28/2022   OPHTHALMOLOGY EXAM  10/25/2022   TETANUS/TDAP  04/20/2031   Pneumonia Vaccine 71 Years old  Completed   Hepatitis C Screening  Completed   HPV VACCINES  Aged Out    Health Maintenance  Health Maintenance Due  Topic Date Due   Zoster Vaccines- Shingrix (1 of 2) Never done   COVID-19 Vaccine (5 - Moderna series) 06/07/2021   COLONOSCOPY (Pts 45-445yrInsurance coverage will need to be confirmed)  06/30/2021   FOOT EXAM  10/11/2021   HEMOGLOBIN A1C  10/18/2021   INFLUENZA VACCINE  01/28/2022    Colorectal cancer screening: Referral to GI placed declined by patient . Pt aware the office will call re: appt.  Lung Cancer Screening: (Low Dose CT Chest recommended if Age 71-80ears, 30 pack-year currently smoking OR have quit w/in 15years.) does not qualify.   Lung Cancer Screening Referral: n/a  Additional Screening:  Hepatitis C Screening: does not qualify;   Vision Screening: Recommended annual ophthalmology exams for early detection of glaucoma and other disorders of the eye. Is the patient up to date with their annual eye exam?  Yes  Who is the provider or what is the name of the office in which the patient attends annual eye exams? [facility]  If pt is not established with a provider, would they like to be referred to a provider to establish care? No .   Dental Screening: Recommended annual dental exams for proper oral hygiene  Community Resource Referral / Chronic Care Management: CRR required this visit?  No   CCM required this visit?  No      Plan:     I have personally reviewed and noted the following in the patient's chart:   Medical and social history Use of alcohol, tobacco or illicit drugs  Current medications and supplements including opioid prescriptions. Patient is not currently taking opioid prescriptions. Functional ability and  status Nutritional status Physical activity Advanced directives List of other physicians Hospitalizations, surgeries, and ER visits in previous 12 months Vitals Screenings to include cognitive, depression, and falls Referrals and appointments  In addition, I have reviewed and discussed with patient certain preventive protocols, quality metrics, and best practice recommendations. A written personalized care plan for preventive services as well as general preventive health recommendations were provided to patient.     LaDaphane ShepherdLPN   9/02/28/4781 Nurse Notes:

## 2022-03-04 NOTE — Patient Instructions (Signed)
Norman Bradley , Thank you for taking time to come for your Medicare Wellness Visit. I appreciate your ongoing commitment to your health goals. Please review the following plan we discussed and let me know if I can assist you in the future.   Screening recommendations/referrals: Colonoscopy: Patient declines  Recommended yearly ophthalmology/optometry visit for glaucoma screening and checkup Recommended yearly dental visit for hygiene and checkup  Vaccinations: Influenza vaccine: due in the fall  Pneumococcal vaccine: completed  Tdap vaccine: 04/19/2022 Shingles vaccine: will consider   Covid-19: completed  Advanced directives: Please bring a copy of your health care power of attorney and living will to the office to be added to your chart at your convenience.   Conditions/risks identified: Aim for 30 minutes of exercise or brisk walking, 6-8 glasses of water, and 5 servings of fruits and vegetables each day.   Next appointment: Follow up in one year for your annual wellness visit.   Preventive Care 71 Years and Older, Male  Preventive care refers to lifestyle choices and visits with your health care provider that can promote health and wellness. What does preventive care include? A yearly physical exam. This is also called an annual well check. Dental exams once or twice a year. Routine eye exams. Ask your health care provider how often you should have your eyes checked. Personal lifestyle choices, including: Daily care of your teeth and gums. Regular physical activity. Eating a healthy diet. Avoiding tobacco and drug use. Limiting alcohol use. Practicing safe sex. Taking low doses of aspirin every day. Taking vitamin and mineral supplements as recommended by your health care provider. What happens during an annual well check? The services and screenings done by your health care provider during your annual well check will depend on your age, overall health, lifestyle risk  factors, and family history of disease. Counseling  Your health care provider may ask you questions about your: Alcohol use. Tobacco use. Drug use. Emotional well-being. Home and relationship well-being. Sexual activity. Eating habits. History of falls. Memory and ability to understand (cognition). Work and work Statistician. Screening  You may have the following tests or measurements: Height, weight, and BMI. Blood pressure. Lipid and cholesterol levels. These may be checked every 5 years, or more frequently if you are over 27 years old. Skin check. Lung cancer screening. You may have this screening every year starting at age 54 if you have a 30-pack-year history of smoking and currently smoke or have quit within the past 15 years. Fecal occult blood test (FOBT) of the stool. You may have this test every year starting at age 74. Flexible sigmoidoscopy or colonoscopy. You may have a sigmoidoscopy every 5 years or a colonoscopy every 10 years starting at age 78. Prostate cancer screening. Recommendations will vary depending on your family history and other risks. Hepatitis C blood test. Hepatitis B blood test. Sexually transmitted disease (STD) testing. Diabetes screening. This is done by checking your blood sugar (glucose) after you have not eaten for a while (fasting). You may have this done every 1-3 years. Abdominal aortic aneurysm (AAA) screening. You may need this if you are a current or former smoker. Osteoporosis. You may be screened starting at age 90 if you are at high risk. Talk with your health care provider about your test results, treatment options, and if necessary, the need for more tests. Vaccines  Your health care provider may recommend certain vaccines, such as: Influenza vaccine. This is recommended every year. Tetanus, diphtheria, and acellular  pertussis (Tdap, Td) vaccine. You may need a Td booster every 10 years. Zoster vaccine. You may need this after age  53. Pneumococcal 13-valent conjugate (PCV13) vaccine. One dose is recommended after age 41. Pneumococcal polysaccharide (PPSV23) vaccine. One dose is recommended after age 74. Talk to your health care provider about which screenings and vaccines you need and how often you need them. This information is not intended to replace advice given to you by your health care provider. Make sure you discuss any questions you have with your health care provider. Document Released: 07/13/2015 Document Revised: 03/05/2016 Document Reviewed: 04/17/2015 Elsevier Interactive Patient Education  2017 [place] Prevention in the Home Falls can cause injuries. They can happen to people of all ages. There are many things you can do to make your home safe and to help prevent falls. What can I do on the outside of my home? Regularly fix the edges of walkways and driveways and fix any cracks. Remove anything that might make you trip as you walk through a door, such as a raised step or threshold. Trim any bushes or trees on the path to your home. Use bright outdoor lighting. Clear any walking paths of anything that might make someone trip, such as rocks or tools. Regularly check to see if handrails are loose or broken. Make sure that both sides of any steps have handrails. Any raised decks and porches should have guardrails on the edges. Have any leaves, snow, or ice cleared regularly. Use sand or salt on walking paths during winter. Clean up any spills in your garage right away. This includes oil or grease spills. What can I do in the bathroom? Use night lights. Install grab bars by the toilet and in the tub and shower. Do not use towel bars as grab bars. Use non-skid mats or decals in the tub or shower. If you need to sit down in the shower, use a plastic, non-slip stool. Keep the floor dry. Clean up any water that spills on the floor as soon as it happens. Remove soap buildup in the tub or shower  regularly. Attach bath mats securely with double-sided non-slip rug tape. Do not have throw rugs and other things on the floor that can make you trip. What can I do in the bedroom? Use night lights. Make sure that you have a light by your bed that is easy to reach. Do not use any sheets or blankets that are too big for your bed. They should not hang down onto the floor. Have a firm chair that has side arms. You can use this for support while you get dressed. Do not have throw rugs and other things on the floor that can make you trip. What can I do in the kitchen? Clean up any spills right away. Avoid walking on wet floors. Keep items that you use a lot in easy-to-reach places. If you need to reach something above you, use a strong step stool that has a grab bar. Keep electrical cords out of the way. Do not use floor polish or wax that makes floors slippery. If you must use wax, use non-skid floor wax. Do not have throw rugs and other things on the floor that can make you trip. What can I do with my stairs? Do not leave any items on the stairs. Make sure that there are handrails on both sides of the stairs and use them. Fix handrails that are broken or loose. Make sure that handrails  are as long as the stairways. Check any carpeting to make sure that it is firmly attached to the stairs. Fix any carpet that is loose or worn. Avoid having throw rugs at the top or bottom of the stairs. If you do have throw rugs, attach them to the floor with carpet tape. Make sure that you have a light switch at the top of the stairs and the bottom of the stairs. If you do not have them, ask someone to add them for you. What else can I do to help prevent falls? Wear shoes that: Do not have high heels. Have rubber bottoms. Are comfortable and fit you well. Are closed at the toe. Do not wear sandals. If you use a stepladder: Make sure that it is fully opened. Do not climb a closed stepladder. Make sure that  both sides of the stepladder are locked into place. Ask someone to hold it for you, if possible. Clearly mark and make sure that you can see: Any grab bars or handrails. First and last steps. Where the edge of each step is. Use tools that help you move around (mobility aids) if they are needed. These include: Canes. Walkers. Scooters. Crutches. Turn on the lights when you go into a dark area. Replace any light bulbs as soon as they burn out. Set up your furniture so you have a clear path. Avoid moving your furniture around. If any of your floors are uneven, fix them. If there are any pets around you, be aware of where they are. Review your medicines with your doctor. Some medicines can make you feel dizzy. This can increase your chance of falling. Ask your doctor what other things that you can do to help prevent falls. This information is not intended to replace advice given to you by your health care provider. Make sure you discuss any questions you have with your health care provider. Document Released: 04/12/2009 Document Revised: 11/22/2015 Document Reviewed: 07/21/2014 Elsevier Interactive Patient Education  2017 [employer].

## 2022-03-17 DIAGNOSIS — D2261 Melanocytic nevi of right upper limb, including shoulder: Secondary | ICD-10-CM | POA: Diagnosis not present

## 2022-03-17 DIAGNOSIS — L821 Other seborrheic keratosis: Secondary | ICD-10-CM | POA: Diagnosis not present

## 2022-03-17 DIAGNOSIS — L218 Other seborrheic dermatitis: Secondary | ICD-10-CM | POA: Diagnosis not present

## 2022-03-17 DIAGNOSIS — D1801 Hemangioma of skin and subcutaneous tissue: Secondary | ICD-10-CM | POA: Diagnosis not present

## 2022-03-17 DIAGNOSIS — D225 Melanocytic nevi of trunk: Secondary | ICD-10-CM | POA: Diagnosis not present

## 2022-03-17 DIAGNOSIS — D2262 Melanocytic nevi of left upper limb, including shoulder: Secondary | ICD-10-CM | POA: Diagnosis not present

## 2022-03-17 DIAGNOSIS — L404 Guttate psoriasis: Secondary | ICD-10-CM | POA: Diagnosis not present

## 2022-03-17 DIAGNOSIS — L814 Other melanin hyperpigmentation: Secondary | ICD-10-CM | POA: Diagnosis not present

## 2022-04-05 IMAGING — US US ABDOMEN COMPLETE
1 series · 13 of 25 positions shown · non-contrast
Comparison: None.

CLINICAL DATA: Elevated LFTs

EXAM:
ABDOMEN ULTRASOUND COMPLETE

[Series 1: us abdomen complete · 13 of 75 slices shown]
[im 1/75]
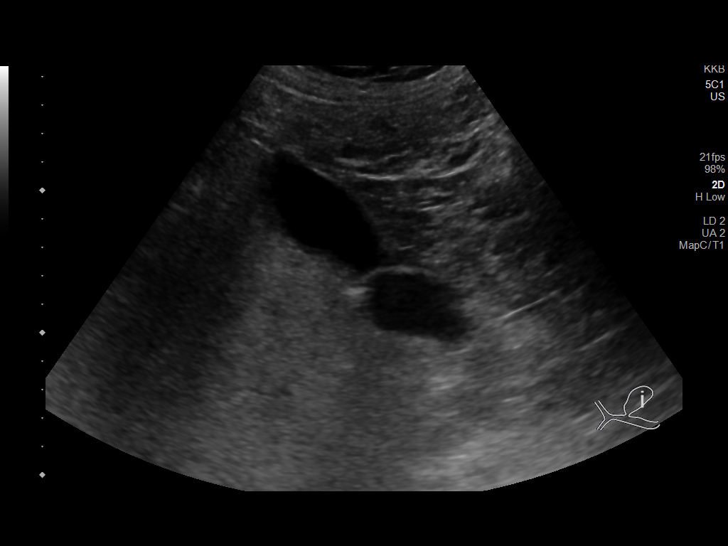
[im 7/75]
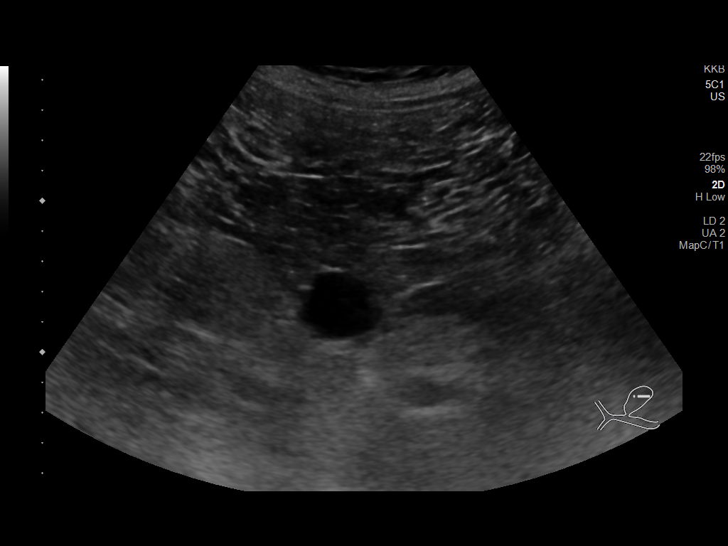
[im 13/75]
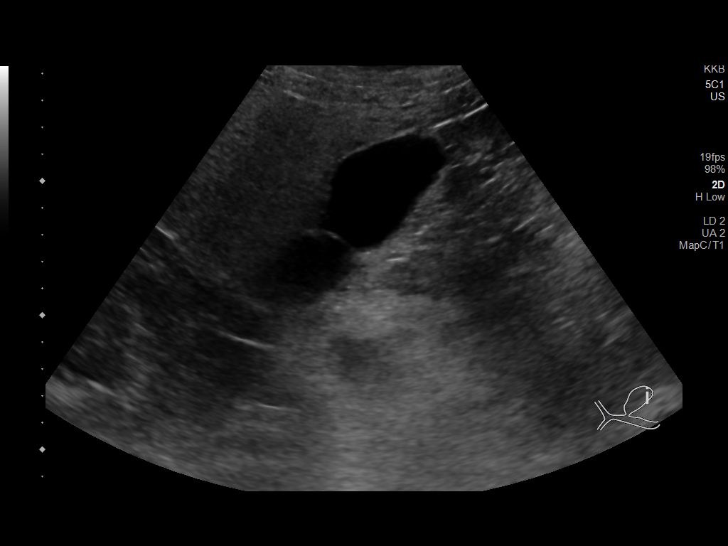
[im 19/75]
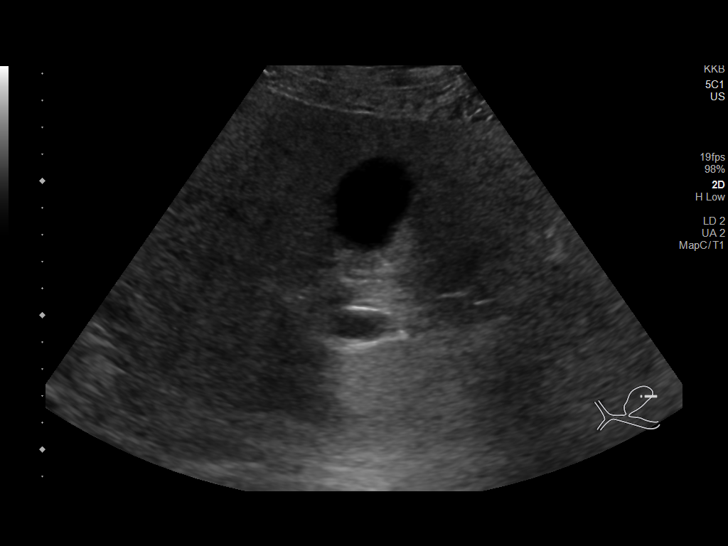
[im 25/75]
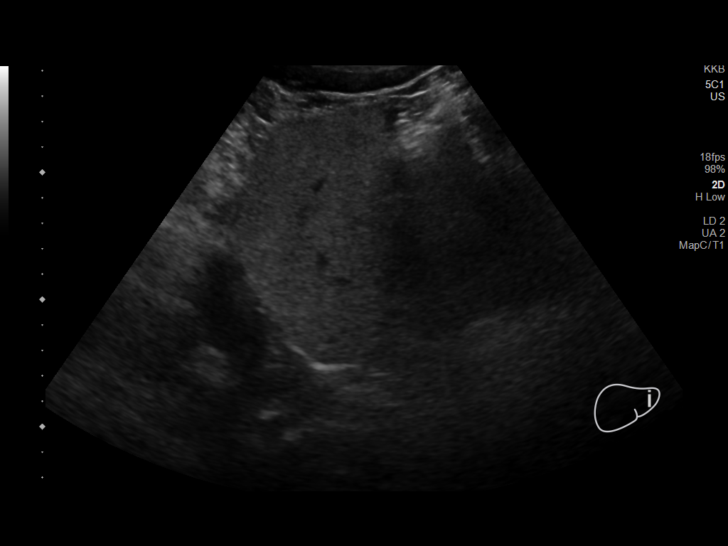
[im 31/75]
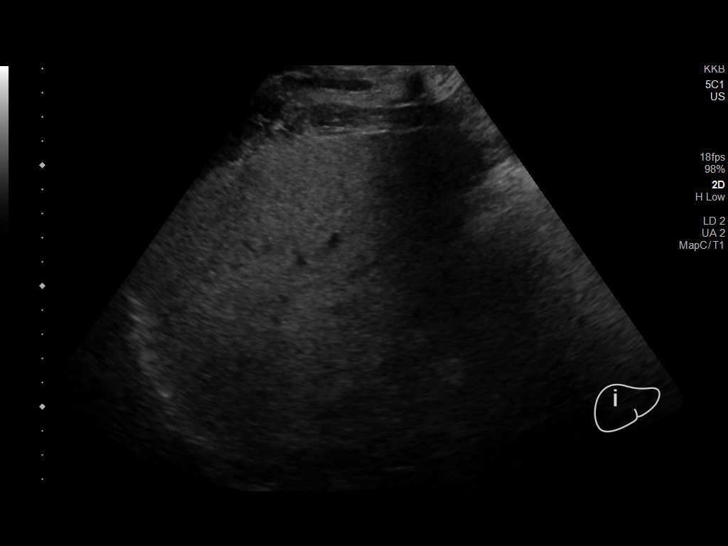
[im 38/75]
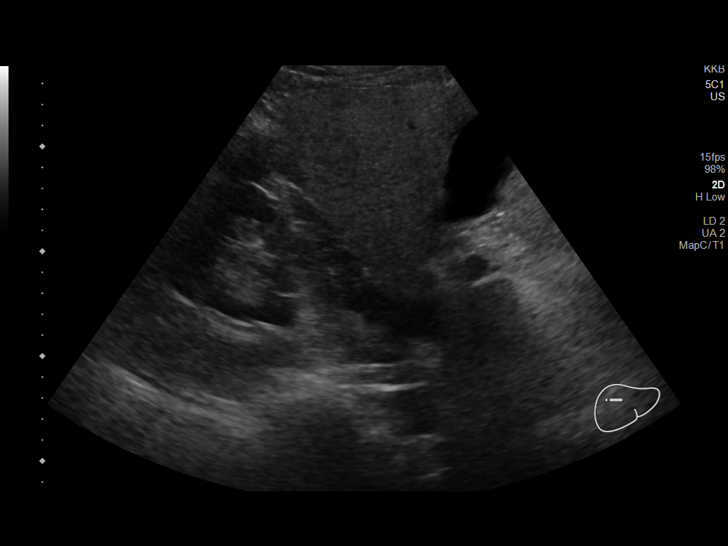
[im 44/75]
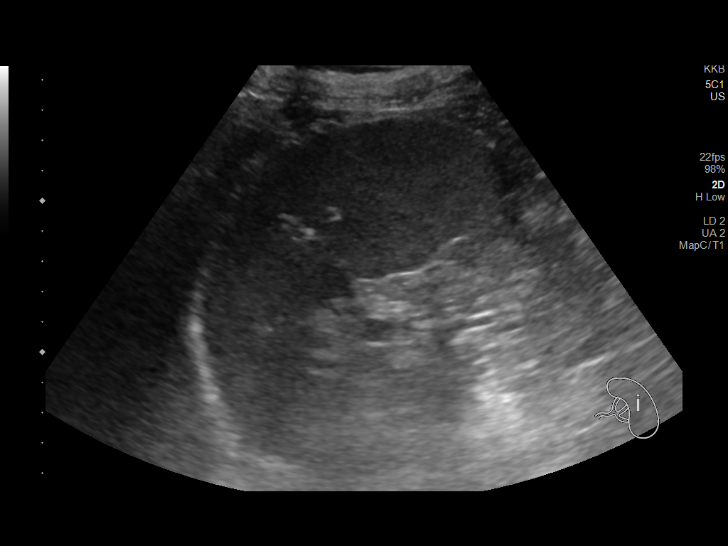
[im 50/75]
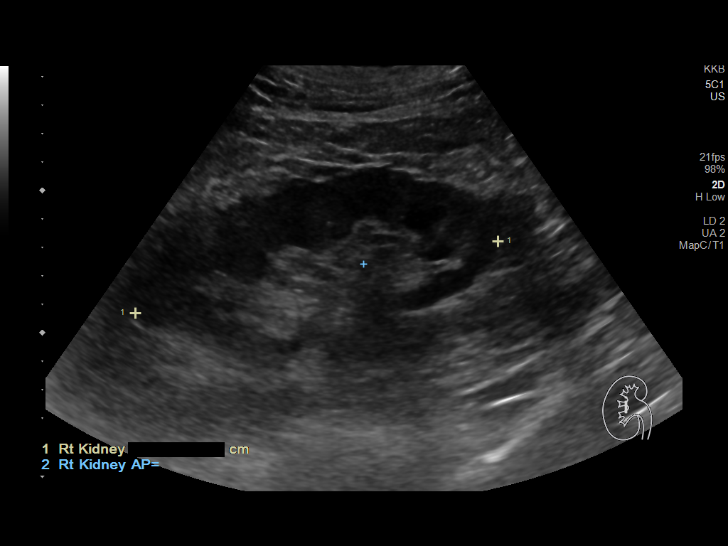
[im 56/75]
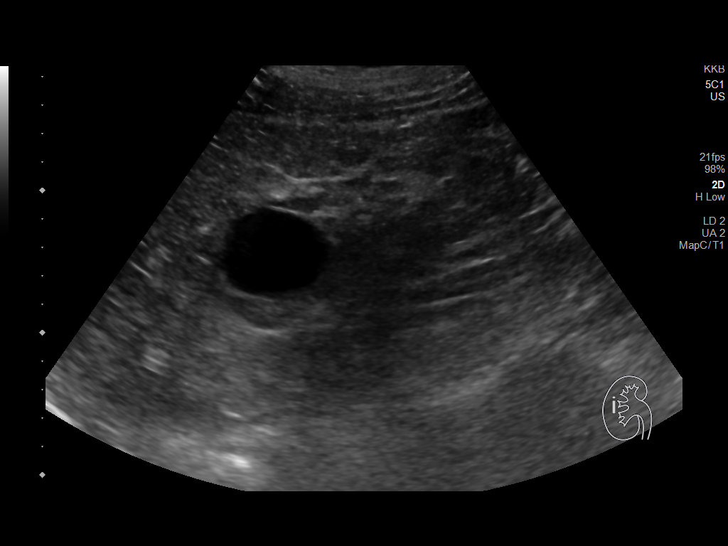
[im 62/75]
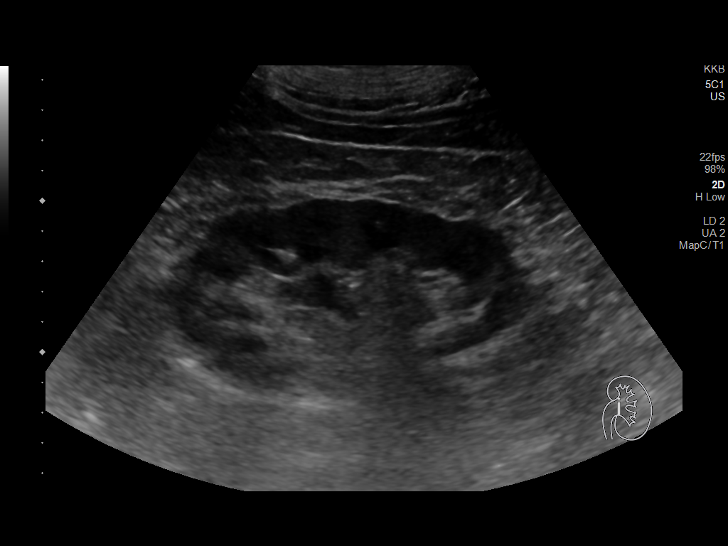
[im 68/75]
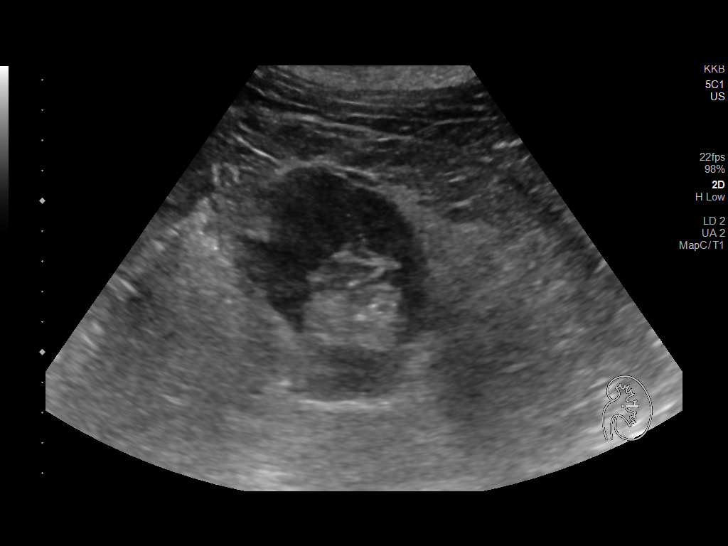
[im 75/75]
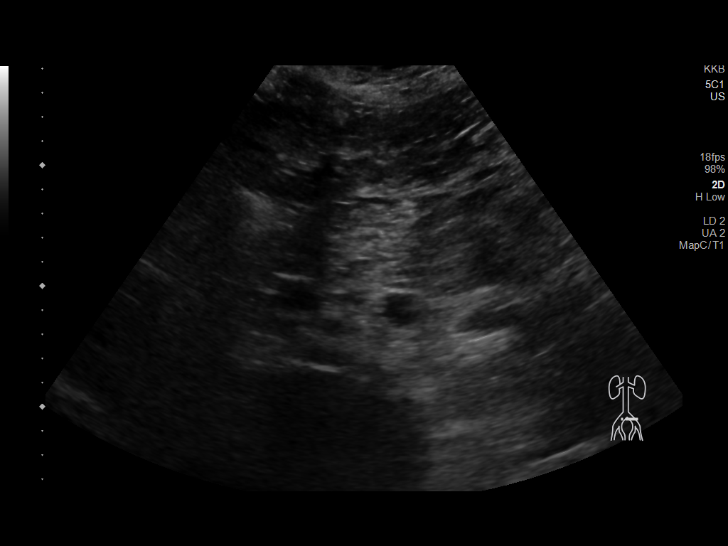

[13 of 25 positions shown; findings below may reference images not displayed]

FINDINGS: Gallbladder: No gallstones or wall thickening visualized. No
sonographic Murphy sign noted by sonographer.

Common bile duct: Diameter: 2.2 mm, nondilated

Liver: Diffusely increased hepatic echogenicity with loss of
definition of the portal triads and diminished posterior through
transmission compatible with hepatic steatosis. Geographic sparing
is noted along the gallbladder fossa. Diminished through
transmission may limit detection of subtle hepatic lesions though no
focal liver lesion is visible. Smooth surface contour. Portal vein
is patent on color Doppler imaging with normal direction of blood
flow towards the liver.

IVC: No abnormality visualized.

Pancreas: Visualized portion unremarkable.

Spleen: 10.6 cm in length, size and appearance within normal limits.

Right Kidney: Length: 13 cm. 3.8 cm anechoic simple appearing upper
pole exophytic cyst is present. Cortical echogenicity is within
normal limits. No concerning renal mass, shadowing calculus or
hydronephrosis.

Left Kidney: Length: 12.8 cm. Echogenicity is within normal limits.
No concerning renal mass, shadowing calculus or hydronephrosis.

Abdominal aorta: No visible aneurysm.

Other findings: Technically challenging exam due to bowel gas.
IMPRESSION: 1. Technically challenging exam due to bowel gas.
2. Diffusely increased hepatic echogenicity, most commonly seen with
hepatic steatosis. Geographic sparing is noted along the gallbladder
fossa. No focal liver lesions are visible.
3. Simple appearing 3.8 cm right upper pole renal cyst.
4. Otherwise unremarkable abdominal ultrasound.

## 2022-04-07 DIAGNOSIS — H2513 Age-related nuclear cataract, bilateral: Secondary | ICD-10-CM | POA: Diagnosis not present

## 2022-04-07 DIAGNOSIS — H18413 Arcus senilis, bilateral: Secondary | ICD-10-CM | POA: Diagnosis not present

## 2022-04-07 DIAGNOSIS — H2512 Age-related nuclear cataract, left eye: Secondary | ICD-10-CM | POA: Diagnosis not present

## 2022-04-07 DIAGNOSIS — H25013 Cortical age-related cataract, bilateral: Secondary | ICD-10-CM | POA: Diagnosis not present

## 2022-04-07 DIAGNOSIS — H35352 Cystoid macular degeneration, left eye: Secondary | ICD-10-CM | POA: Diagnosis not present

## 2022-04-24 ENCOUNTER — Telehealth: Payer: Self-pay | Admitting: Family Medicine

## 2022-04-24 ENCOUNTER — Encounter: Payer: Self-pay | Admitting: Family Medicine

## 2022-04-24 ENCOUNTER — Other Ambulatory Visit: Payer: Self-pay | Admitting: Family Medicine

## 2022-04-24 DIAGNOSIS — E119 Type 2 diabetes mellitus without complications: Secondary | ICD-10-CM

## 2022-04-24 MED ORDER — RYBELSUS 7 MG PO TABS: 1.0000 | ORAL_TABLET | Freq: Every day | ORAL | 0 refills | Status: DC

## 2022-04-24 NOTE — Telephone Encounter (Signed)
Caller Name: Reedy Biernat Call back phone #: [phone]   MEDICATION(S):  Rybelsus  Has the patient contacted their pharmacy (YES/NO)? Yes, What did pharmacy advise? Said he needed to call in to have Dr. Ethelene Hal send a prescription over  Preferred Pharmacy:  [facility], [address]  Phone:  [phone]  Fax:  [phone]  ~~~Please advise patient/caregiver to allow 2-3 business days to process RX refills.

## 2022-04-25 ENCOUNTER — Ambulatory Visit (INDEPENDENT_AMBULATORY_CARE_PROVIDER_SITE_OTHER): Payer: Medicare Other | Admitting: Family Medicine

## 2022-04-25 ENCOUNTER — Encounter: Payer: Self-pay | Admitting: Family Medicine

## 2022-04-25 VITALS — BP 132/78 | HR 74 | Temp 97.5°F | Ht 70.0 in | Wt 238.4 lb

## 2022-04-25 DIAGNOSIS — E119 Type 2 diabetes mellitus without complications: Secondary | ICD-10-CM | POA: Diagnosis not present

## 2022-04-25 DIAGNOSIS — I10 Essential (primary) hypertension: Secondary | ICD-10-CM | POA: Diagnosis not present

## 2022-04-25 DIAGNOSIS — E7800 Pure hypercholesterolemia, unspecified: Secondary | ICD-10-CM | POA: Diagnosis not present

## 2022-04-25 DIAGNOSIS — E669 Obesity, unspecified: Secondary | ICD-10-CM

## 2022-04-25 DIAGNOSIS — Z125 Encounter for screening for malignant neoplasm of prostate: Secondary | ICD-10-CM | POA: Diagnosis not present

## 2022-04-25 MED ORDER — RYBELSUS 14 MG PO TABS: 14.0000 mg | ORAL_TABLET | Freq: Every day | ORAL | 3 refills | Status: DC

## 2022-04-25 MED ORDER — METFORMIN HCL 1000 MG PO TABS: ORAL_TABLET | ORAL | 2 refills | Status: DC

## 2022-04-25 NOTE — Progress Notes (Signed)
Established Patient Office Visit  Subjective   Patient ID: Norman Bradley, male    DOB: 04-21-1951  Age: 71 y.o. MRN: [id number]  Chief Complaint  Patient presents with   Follow-up    Routine follow up discuss Rybelsus     HPI follow-up of type 2 diabetes, elevated cholesterol hypertension and obesity. Lost of fu for over 1 year. Was asked to follow up in 6 months.     Review of Systems  Constitutional: Negative.   HENT: Negative.    Eyes:  Negative for blurred vision, discharge and redness.  Respiratory: Negative.    Cardiovascular: Negative.   Gastrointestinal:  Negative for abdominal pain.  Genitourinary: Negative.   Musculoskeletal: Negative.  Negative for myalgias.  Skin:  Negative for rash.  Neurological:  Negative for tingling, loss of consciousness and weakness.  Endo/Heme/Allergies:  Negative for polydipsia.      Objective:     BP 132/78 (BP Location: Right Arm, Patient Position: Sitting, Cuff Size: Large)   Pulse 74   Temp (!) 97.5 F (36.4 C) (Temporal)   Ht '5\' 10"'$  (1.778 m)   Wt 238 lb 6.4 oz (108.1 kg)   SpO2 98%   BMI 34.21 kg/m  BP Readings from Last 3 Encounters:  04/25/22 132/78  04/19/21 126/70  10/11/20 132/82   Wt Readings from Last 3 Encounters:  04/25/22 238 lb 6.4 oz (108.1 kg)  03/04/22 220 lb (99.8 kg)  04/19/21 241 lb 12.8 oz (109.7 kg)      Physical Exam Constitutional:      General: He is not in acute distress.    Appearance: Normal appearance. He is not ill-appearing, toxic-appearing or diaphoretic.  HENT:     Head: Normocephalic and atraumatic.     Right Ear: External ear normal.     Left Ear: External ear normal.     Mouth/Throat:     Mouth: Mucous membranes are moist.     Pharynx: Oropharynx is clear. No oropharyngeal exudate or posterior oropharyngeal erythema.  Eyes:     General: No scleral icterus.       Right eye: No discharge.        Left eye: No discharge.     Extraocular Movements: Extraocular  movements intact.     Conjunctiva/sclera: Conjunctivae normal.     Pupils: Pupils are equal, round, and reactive to light.  Cardiovascular:     Rate and Rhythm: Normal rate and regular rhythm.     Pulses:          Dorsalis pedis pulses are 2+ on the right side and 2+ on the left side.       Posterior tibial pulses are 1+ on the right side and 1+ on the left side.  Pulmonary:     Effort: Pulmonary effort is normal. No respiratory distress.     Breath sounds: Normal breath sounds.  Abdominal:     General: Bowel sounds are normal.     Tenderness: There is no abdominal tenderness. There is no guarding.  Musculoskeletal:     Cervical back: No rigidity or tenderness.  Skin:    General: Skin is warm and dry.  Neurological:     Mental Status: He is alert and oriented to person, place, and time.  Psychiatric:        Mood and Affect: Mood normal.        Behavior: Behavior normal.    Diabetic Foot Exam - Simple   Simple Foot Form Diabetic Foot exam  was performed with the following findings: Yes 04/25/2022  2:31 PM  Visual Inspection See comments: Yes Sensation Testing Intact to touch and monofilament testing bilaterally: Yes Pulse Check Posterior Tibialis and Dorsalis pulse intact bilaterally: Yes Comments Feet are cavus bilaterally without lesions or ulcerations.      No results found for any visits on 04/25/22.    The 10-year ASCVD risk score (Arnett DK, et al., 2019) is: 45.8%    Assessment & Plan:   Problem List Items Addressed This Visit       Cardiovascular and Mediastinum   Essential hypertension   Relevant Orders   CBC   Comprehensive metabolic panel   Urinalysis, Routine w reflex microscopic   Microalbumin / creatinine urine ratio     Endocrine   Type 2 diabetes mellitus without complication, without long-term current use of insulin (HCC) - Primary   Relevant Medications   Semaglutide (RYBELSUS) 14 MG TABS   metFORMIN (GLUCOPHAGE) 1000 MG tablet   Other  Relevant Orders   Comprehensive metabolic panel   Hemoglobin A1c   Urinalysis, Routine w reflex microscopic   Microalbumin / creatinine urine ratio     Other   Screening for prostate cancer   Relevant Orders   PSA   Elevated LDL cholesterol level   Relevant Orders   Comprehensive metabolic panel   Lipid panel   Other Visit Diagnoses     Obesity (BMI 30-39.9)       Relevant Medications   Semaglutide (RYBELSUS) 14 MG TABS   metFORMIN (GLUCOPHAGE) 1000 MG tablet       Return in about 3 months (around 07/26/2022), or Return fasting for above ordered blood work..  Please try to lose weight.  Have increased Rybelsus to 14 mg daily.  Continue metformin 1000 mg twice daily.  Continue Maibach at 1 mg and pravastatin at 20 mg.  Libby Maw, MD

## 2022-04-28 ENCOUNTER — Other Ambulatory Visit (INDEPENDENT_AMBULATORY_CARE_PROVIDER_SITE_OTHER): Payer: Medicare Other

## 2022-04-28 DIAGNOSIS — I10 Essential (primary) hypertension: Secondary | ICD-10-CM | POA: Diagnosis not present

## 2022-04-28 DIAGNOSIS — Z125 Encounter for screening for malignant neoplasm of prostate: Secondary | ICD-10-CM

## 2022-04-28 DIAGNOSIS — E7800 Pure hypercholesterolemia, unspecified: Secondary | ICD-10-CM | POA: Diagnosis not present

## 2022-04-28 DIAGNOSIS — E119 Type 2 diabetes mellitus without complications: Secondary | ICD-10-CM | POA: Diagnosis not present

## 2022-04-28 LAB — COMPREHENSIVE METABOLIC PANEL
ALT: 42 U/L (ref 0–53)
AST: 29 U/L (ref 0–37)
Albumin: 4.4 g/dL (ref 3.5–5.2)
Alkaline Phosphatase: 50 U/L (ref 39–117)
BUN: 23 mg/dL (ref 6–23)
CO2: 28 mEq/L (ref 19–32)
Calcium: 9.7 mg/dL (ref 8.4–10.5)
Chloride: 101 mEq/L (ref 96–112)
Creatinine, Ser: 1.09 mg/dL (ref 0.40–1.50)
GFR: 68.5 mL/min (ref >60.00)
Glucose, Bld: 138 mg/dL — ABNORMAL HIGH (ref 70–99)
Potassium: 4.2 mEq/L (ref 3.5–5.1)
Sodium: 138 mEq/L (ref 135–145)
Total Bilirubin: 1.1 mg/dL (ref 0.2–1.2)
Total Protein: 7.3 g/dL (ref 6.0–8.3)

## 2022-04-28 LAB — URINALYSIS, ROUTINE W REFLEX MICROSCOPIC
Bilirubin Urine: NEGATIVE
Hgb urine dipstick: NEGATIVE
Ketones, ur: NEGATIVE
Leukocytes,Ua: NEGATIVE
Nitrite: NEGATIVE
RBC / HPF: NONE SEEN (ref 0–?)
Specific Gravity, Urine: 1.02 (ref 1.000–1.030)
Total Protein, Urine: NEGATIVE
Urine Glucose: NEGATIVE
Urobilinogen, UA: 1 (ref 0.0–1.0)
WBC, UA: NONE SEEN (ref 0–?)
pH: 5.5 (ref 5.0–8.0)

## 2022-04-28 LAB — CBC
HCT: 49.5 % (ref 39.0–52.0)
Hemoglobin: 16.7 g/dL (ref 13.0–17.0)
MCHC: 33.7 g/dL (ref 30.0–36.0)
MCV: 90.7 fl (ref 78.0–100.0)
Platelets: 166 10*3/uL (ref 150.0–400.0)
RBC: 5.46 Mil/uL (ref 4.22–5.81)
RDW: 13.9 % (ref 11.5–15.5)
WBC: 7.7 10*3/uL (ref 4.0–10.5)

## 2022-04-28 LAB — LIPID PANEL
Cholesterol: 134 mg/dL (ref 0–200)
HDL: 30.8 mg/dL — ABNORMAL LOW (ref >39.00)
NonHDL: 102.87
Total CHOL/HDL Ratio: 4
Triglycerides: 253 mg/dL — ABNORMAL HIGH (ref 0.0–149.0)
VLDL: 50.6 mg/dL — ABNORMAL HIGH (ref 0.0–40.0)

## 2022-04-28 LAB — MICROALBUMIN / CREATININE URINE RATIO
Creatinine,U: 109.7 mg/dL
Microalb Creat Ratio: 0.6 mg/g (ref 0.0–30.0)
Microalb, Ur: <0.7 mg/dL (ref 0.0–1.9)

## 2022-04-28 LAB — PSA: PSA: 1.3 ng/mL (ref 0.10–4.00)

## 2022-04-28 LAB — LDL CHOLESTEROL, DIRECT: Direct LDL: 72 mg/dL

## 2022-04-28 LAB — HEMOGLOBIN A1C: Hgb A1c MFr Bld: 6.4 % (ref 4.6–6.5)

## 2022-05-13 DIAGNOSIS — D3131 Benign neoplasm of right choroid: Secondary | ICD-10-CM | POA: Diagnosis not present

## 2022-05-13 DIAGNOSIS — H35352 Cystoid macular degeneration, left eye: Secondary | ICD-10-CM | POA: Diagnosis not present

## 2022-05-13 DIAGNOSIS — H43822 Vitreomacular adhesion, left eye: Secondary | ICD-10-CM | POA: Diagnosis not present

## 2022-05-13 DIAGNOSIS — H43811 Vitreous degeneration, right eye: Secondary | ICD-10-CM | POA: Diagnosis not present

## 2022-05-19 DIAGNOSIS — H18413 Arcus senilis, bilateral: Secondary | ICD-10-CM | POA: Diagnosis not present

## 2022-05-19 DIAGNOSIS — H35352 Cystoid macular degeneration, left eye: Secondary | ICD-10-CM | POA: Diagnosis not present

## 2022-05-19 DIAGNOSIS — E119 Type 2 diabetes mellitus without complications: Secondary | ICD-10-CM | POA: Diagnosis not present

## 2022-05-19 DIAGNOSIS — H25013 Cortical age-related cataract, bilateral: Secondary | ICD-10-CM | POA: Diagnosis not present

## 2022-05-27 ENCOUNTER — Telehealth: Payer: Self-pay | Admitting: Family Medicine

## 2022-05-27 NOTE — Telephone Encounter (Signed)
Reached out to patient to get dates he received vaccine they have been added to is his chart.

## 2022-05-27 NOTE — Telephone Encounter (Signed)
Caller Name: Con Arganbright Call back phone #: [phone]  Reason for Call: Pt wanted for [place] to know that he received the RSV and Covid vaccine

## 2022-06-16 DIAGNOSIS — E113312 Type 2 diabetes mellitus with moderate nonproliferative diabetic retinopathy with macular edema, left eye: Secondary | ICD-10-CM | POA: Diagnosis not present

## 2022-06-16 DIAGNOSIS — H43822 Vitreomacular adhesion, left eye: Secondary | ICD-10-CM | POA: Diagnosis not present

## 2022-06-16 DIAGNOSIS — H43811 Vitreous degeneration, right eye: Secondary | ICD-10-CM | POA: Diagnosis not present

## 2022-06-16 DIAGNOSIS — E113211 Type 2 diabetes mellitus with mild nonproliferative diabetic retinopathy with macular edema, right eye: Secondary | ICD-10-CM | POA: Diagnosis not present

## 2022-07-25 ENCOUNTER — Ambulatory Visit: Payer: Medicare Other | Admitting: Family Medicine

## 2022-07-28 DIAGNOSIS — H25043 Posterior subcapsular polar age-related cataract, bilateral: Secondary | ICD-10-CM | POA: Diagnosis not present

## 2022-07-28 DIAGNOSIS — H25013 Cortical age-related cataract, bilateral: Secondary | ICD-10-CM | POA: Diagnosis not present

## 2022-07-28 DIAGNOSIS — H2513 Age-related nuclear cataract, bilateral: Secondary | ICD-10-CM | POA: Diagnosis not present

## 2022-07-28 DIAGNOSIS — H35352 Cystoid macular degeneration, left eye: Secondary | ICD-10-CM | POA: Diagnosis not present

## 2022-08-19 DIAGNOSIS — H2512 Age-related nuclear cataract, left eye: Secondary | ICD-10-CM | POA: Diagnosis not present

## 2022-08-19 DIAGNOSIS — H269 Unspecified cataract: Secondary | ICD-10-CM | POA: Diagnosis not present

## 2022-08-19 DIAGNOSIS — H2511 Age-related nuclear cataract, right eye: Secondary | ICD-10-CM | POA: Diagnosis not present

## 2022-08-25 DIAGNOSIS — E113312 Type 2 diabetes mellitus with moderate nonproliferative diabetic retinopathy with macular edema, left eye: Secondary | ICD-10-CM | POA: Diagnosis not present

## 2022-09-02 DIAGNOSIS — H2512 Age-related nuclear cataract, left eye: Secondary | ICD-10-CM | POA: Diagnosis not present

## 2022-09-02 DIAGNOSIS — H269 Unspecified cataract: Secondary | ICD-10-CM | POA: Diagnosis not present

## 2022-09-02 DIAGNOSIS — E119 Type 2 diabetes mellitus without complications: Secondary | ICD-10-CM | POA: Diagnosis not present

## 2022-10-02 ENCOUNTER — Other Ambulatory Visit: Payer: Self-pay | Admitting: Family Medicine

## 2022-10-02 DIAGNOSIS — E119 Type 2 diabetes mellitus without complications: Secondary | ICD-10-CM

## 2022-10-02 DIAGNOSIS — R030 Elevated blood-pressure reading, without diagnosis of hypertension: Secondary | ICD-10-CM

## 2022-10-06 DIAGNOSIS — E113211 Type 2 diabetes mellitus with mild nonproliferative diabetic retinopathy with macular edema, right eye: Secondary | ICD-10-CM | POA: Diagnosis not present

## 2022-10-06 DIAGNOSIS — E113312 Type 2 diabetes mellitus with moderate nonproliferative diabetic retinopathy with macular edema, left eye: Secondary | ICD-10-CM | POA: Diagnosis not present

## 2022-10-06 DIAGNOSIS — H43822 Vitreomacular adhesion, left eye: Secondary | ICD-10-CM | POA: Diagnosis not present

## 2022-10-06 DIAGNOSIS — H43811 Vitreous degeneration, right eye: Secondary | ICD-10-CM | POA: Diagnosis not present

## 2022-10-09 DIAGNOSIS — M25511 Pain in right shoulder: Secondary | ICD-10-CM | POA: Diagnosis not present

## 2022-10-09 DIAGNOSIS — M67813 Other specified disorders of tendon, right shoulder: Secondary | ICD-10-CM | POA: Diagnosis not present

## 2022-10-29 DIAGNOSIS — M25511 Pain in right shoulder: Secondary | ICD-10-CM | POA: Diagnosis not present

## 2022-10-30 ENCOUNTER — Other Ambulatory Visit: Payer: Self-pay | Admitting: Family Medicine

## 2022-10-30 DIAGNOSIS — E7800 Pure hypercholesterolemia, unspecified: Secondary | ICD-10-CM

## 2022-11-09 ENCOUNTER — Encounter: Payer: Self-pay | Admitting: Family Medicine

## 2022-11-10 ENCOUNTER — Other Ambulatory Visit: Payer: Self-pay

## 2022-11-10 DIAGNOSIS — E7800 Pure hypercholesterolemia, unspecified: Secondary | ICD-10-CM

## 2022-11-10 MED ORDER — PRAVASTATIN SODIUM 20 MG PO TABS: ORAL_TABLET | ORAL | 3 refills | Status: DC

## 2022-11-19 DIAGNOSIS — M67813 Other specified disorders of tendon, right shoulder: Secondary | ICD-10-CM | POA: Diagnosis not present

## 2023-01-15 ENCOUNTER — Telehealth: Payer: Self-pay

## 2023-01-15 ENCOUNTER — Other Ambulatory Visit: Payer: Self-pay | Admitting: Family Medicine

## 2023-01-15 DIAGNOSIS — E119 Type 2 diabetes mellitus without complications: Secondary | ICD-10-CM

## 2023-01-15 NOTE — Telephone Encounter (Signed)
LVM for pt to call the office to schedule FU Appt with PCP. Pt was supposed to schedule FU Appt around 07/26/22.  Pt needs Urine ACR and BMP or CMP collected, A1c, and to [facility] on [facility]16/23.

## 2023-02-12 ENCOUNTER — Encounter (INDEPENDENT_AMBULATORY_CARE_PROVIDER_SITE_OTHER): Payer: Self-pay

## 2023-02-17 LAB — HM DIABETES EYE EXAM

## 2023-03-10 DIAGNOSIS — E113312 Type 2 diabetes mellitus with moderate nonproliferative diabetic retinopathy with macular edema, left eye: Secondary | ICD-10-CM | POA: Diagnosis not present

## 2023-03-10 DIAGNOSIS — H43822 Vitreomacular adhesion, left eye: Secondary | ICD-10-CM | POA: Diagnosis not present

## 2023-03-10 DIAGNOSIS — E113211 Type 2 diabetes mellitus with mild nonproliferative diabetic retinopathy with macular edema, right eye: Secondary | ICD-10-CM | POA: Diagnosis not present

## 2023-03-10 DIAGNOSIS — H43811 Vitreous degeneration, right eye: Secondary | ICD-10-CM | POA: Diagnosis not present

## 2023-04-03 ENCOUNTER — Encounter: Payer: Self-pay | Admitting: Family Medicine

## 2023-04-13 ENCOUNTER — Other Ambulatory Visit: Payer: Self-pay | Admitting: Family Medicine

## 2023-04-13 DIAGNOSIS — E119 Type 2 diabetes mellitus without complications: Secondary | ICD-10-CM

## 2023-04-16 DIAGNOSIS — L218 Other seborrheic dermatitis: Secondary | ICD-10-CM | POA: Diagnosis not present

## 2023-04-16 DIAGNOSIS — L821 Other seborrheic keratosis: Secondary | ICD-10-CM | POA: Diagnosis not present

## 2023-04-16 DIAGNOSIS — D224 Melanocytic nevi of scalp and neck: Secondary | ICD-10-CM | POA: Diagnosis not present

## 2023-04-16 DIAGNOSIS — D225 Melanocytic nevi of trunk: Secondary | ICD-10-CM | POA: Diagnosis not present

## 2023-04-16 DIAGNOSIS — L812 Freckles: Secondary | ICD-10-CM | POA: Diagnosis not present

## 2023-04-22 ENCOUNTER — Telehealth: Payer: Self-pay

## 2023-04-22 ENCOUNTER — Other Ambulatory Visit: Payer: Self-pay | Admitting: Family Medicine

## 2023-04-22 ENCOUNTER — Other Ambulatory Visit: Payer: Self-pay

## 2023-04-22 DIAGNOSIS — E119 Type 2 diabetes mellitus without complications: Secondary | ICD-10-CM

## 2023-04-22 NOTE — Telephone Encounter (Signed)
Left voicemail informing patient he is due for lab work and to call the office to schedule

## 2023-04-23 ENCOUNTER — Telehealth: Payer: Self-pay | Admitting: Family Medicine

## 2023-04-23 ENCOUNTER — Encounter: Payer: Self-pay | Admitting: Family Medicine

## 2023-04-23 DIAGNOSIS — E119 Type 2 diabetes mellitus without complications: Secondary | ICD-10-CM

## 2023-04-23 MED ORDER — RYBELSUS 14 MG PO TABS: 14.0000 mg | ORAL_TABLET | Freq: Every day | ORAL | 0 refills | Status: DC

## 2023-04-23 NOTE — Telephone Encounter (Signed)
Rx was sent and patient notified VIA my chart. Dm/cma

## 2023-04-23 NOTE — Telephone Encounter (Signed)
Refill request  Pt has made his labs for 10/28.   Semaglutide (RYBELSUS) 14 MG TABS [[id number]]   [id number] - [place], [place] - [address] AT [facility] [address] & [address] [place] [address] Phone: [phone]  Fax: [phone]

## 2023-04-27 ENCOUNTER — Ambulatory Visit (INDEPENDENT_AMBULATORY_CARE_PROVIDER_SITE_OTHER): Payer: Medicare Other | Admitting: Family Medicine

## 2023-04-27 ENCOUNTER — Encounter: Payer: Self-pay | Admitting: Family Medicine

## 2023-04-27 VITALS — BP 116/78 | HR 69 | Temp 98.2°F | Ht 70.0 in | Wt 230.4 lb

## 2023-04-27 DIAGNOSIS — E7800 Pure hypercholesterolemia, unspecified: Secondary | ICD-10-CM

## 2023-04-27 DIAGNOSIS — I10 Essential (primary) hypertension: Secondary | ICD-10-CM | POA: Diagnosis not present

## 2023-04-27 DIAGNOSIS — Z23 Encounter for immunization: Secondary | ICD-10-CM

## 2023-04-27 DIAGNOSIS — Z125 Encounter for screening for malignant neoplasm of prostate: Secondary | ICD-10-CM

## 2023-04-27 DIAGNOSIS — E119 Type 2 diabetes mellitus without complications: Secondary | ICD-10-CM | POA: Diagnosis not present

## 2023-04-27 DIAGNOSIS — Z1211 Encounter for screening for malignant neoplasm of colon: Secondary | ICD-10-CM

## 2023-04-27 LAB — COMPREHENSIVE METABOLIC PANEL
ALT: 27 U/L (ref 0–53)
AST: 23 U/L (ref 0–37)
Albumin: 4.4 g/dL (ref 3.5–5.2)
Alkaline Phosphatase: 44 U/L (ref 39–117)
BUN: 22 mg/dL (ref 6–23)
CO2: 27 meq/L (ref 19–32)
Calcium: 9.4 mg/dL (ref 8.4–10.5)
Chloride: 100 meq/L (ref 96–112)
Creatinine, Ser: 1.11 mg/dL (ref 0.40–1.50)
GFR: 66.56 mL/min (ref >60.00)
Glucose, Bld: 122 mg/dL — ABNORMAL HIGH (ref 70–99)
Potassium: 4.3 meq/L (ref 3.5–5.1)
Sodium: 136 meq/L (ref 135–145)
Total Bilirubin: 1.2 mg/dL (ref 0.2–1.2)
Total Protein: 7.4 g/dL (ref 6.0–8.3)

## 2023-04-27 LAB — CBC WITH DIFFERENTIAL/PLATELET
Basophils Absolute: 0.1 10*3/uL (ref 0.0–0.1)
Basophils Relative: 1 % (ref 0.0–3.0)
Eosinophils Absolute: 0.1 10*3/uL (ref 0.0–0.7)
Eosinophils Relative: 1.9 % (ref 0.0–5.0)
HCT: 49.7 % (ref 39.0–52.0)
Hemoglobin: 16.3 g/dL (ref 13.0–17.0)
Lymphocytes Relative: 30.2 % (ref 12.0–46.0)
Lymphs Abs: 2.1 10*3/uL (ref 0.7–4.0)
MCHC: 32.9 g/dL (ref 30.0–36.0)
MCV: 92.1 fL (ref 78.0–100.0)
Monocytes Absolute: 0.6 10*3/uL (ref 0.1–1.0)
Monocytes Relative: 8 % (ref 3.0–12.0)
Neutro Abs: 4.1 10*3/uL (ref 1.4–7.7)
Neutrophils Relative %: 58.9 % (ref 43.0–77.0)
Platelets: 189 10*3/uL (ref 150.0–400.0)
RBC: 5.4 Mil/uL (ref 4.22–5.81)
RDW: 13.4 % (ref 11.5–15.5)
WBC: 7 10*3/uL (ref 4.0–10.5)

## 2023-04-27 LAB — LIPID PANEL
Cholesterol: 116 mg/dL (ref 0–200)
HDL: 31.7 mg/dL — ABNORMAL LOW (ref >39.00)
LDL Cholesterol: 51 mg/dL (ref 0–99)
NonHDL: 84.4
Total CHOL/HDL Ratio: 4
Triglycerides: 169 mg/dL — ABNORMAL HIGH (ref 0.0–149.0)
VLDL: 33.8 mg/dL (ref 0.0–40.0)

## 2023-04-27 LAB — MICROALBUMIN / CREATININE URINE RATIO
Creatinine,U: 134.8 mg/dL
Microalb Creat Ratio: 0.5 mg/g (ref 0.0–30.0)
Microalb, Ur: <0.7 mg/dL (ref 0.0–1.9)

## 2023-04-27 LAB — HEMOGLOBIN A1C: Hgb A1c MFr Bld: 6 % (ref 4.6–6.5)

## 2023-04-27 LAB — PSA: PSA: 1.35 ng/mL (ref 0.10–4.00)

## 2023-04-27 NOTE — Progress Notes (Addendum)
Established Patient Office Visit   Subjective:  Patient ID: Norman Bradley, male    DOB: [birth year]  Age: 72 y.o. MRN: [id number]  Chief Complaint  Patient presents with   Medical Management of Chronic Issues    Follow up. Pt is fasting and states he is in office for labs.    HPI Encounter Diagnoses  Name Primary?   Type 2 diabetes mellitus without complication, without long-term current use of insulin (HCC) Yes   Essential hypertension    Elevated LDL cholesterol level    Screening for prostate cancer    Need for immunization against influenza    Screening for colon cancer    For follow-up of above treated with medicines below.  No issues taking his medications.  He exercises by walking.  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   Review of Systems  Constitutional: Negative.   HENT: Negative.    Eyes:  Negative for blurred vision, discharge and redness.  Respiratory: Negative.    Cardiovascular: Negative.   Gastrointestinal:  Negative for abdominal pain.  Genitourinary: Negative.   Musculoskeletal: Negative.  Negative for myalgias.  Skin:  Negative for rash.  Neurological:  Negative for tingling, loss of consciousness and weakness.  Endo/Heme/Allergies:  Negative for polydipsia.     Current Outpatient Medications:    betamethasone dipropionate 0.05 % lotion, Apply 1 Application topically at bedtime., Disp: , Rfl:    fluocinonide cream (LIDEX) 0.05 %, Apply topically 2 (two) times daily., Disp: , Rfl:    metFORMIN (GLUCOPHAGE) 1000 MG tablet, TAKE 1 TABLET(1000 MG) BY MOUTH TWICE DAILY WITH A MEAL, Disp: 180 tablet, Rfl: 0   mometasone (ELOCON) 0.1 % cream, SMARTSIG:sparingly Topical Daily, Disp: , Rfl:    pravastatin (PRAVACHOL) 20 MG tablet, TAKE 1 TABLET(20 MG) BY MOUTH DAILY, Disp: 90 tablet, Rfl: 3   Semaglutide (RYBELSUS) 14 MG TABS, Take 1 tablet (14 mg total) by mouth daily., Disp: 90 tablet, Rfl: 0   trandolapril (MAVIK) 1 MG tablet, TAKE 1 TABLET(1 MG) BY MOUTH  DAILY, Disp: 90 tablet, Rfl: 2   Objective:     BP 116/78   Pulse 69   Temp 98.2 F (36.8 C)   Ht 5\' 10"  (1.778 m)   Wt 230 lb 6.4 oz (104.5 kg)   SpO2 97%   BMI 33.06 kg/m  BP Readings from Last 3 Encounters:  04/27/23 116/78  04/25/22 132/78  04/19/21 126/70   Wt Readings from Last 3 Encounters:  04/27/23 230 lb 6.4 oz (104.5 kg)  04/25/22 238 lb 6.4 oz (108.1 kg)  03/04/22 220 lb (99.8 kg)      Physical Exam Constitutional:      General: He is not in acute distress.    Appearance: Normal appearance. He is not ill-appearing, toxic-appearing or diaphoretic.  HENT:     Head: Normocephalic and atraumatic.     Right Ear: External ear normal.     Left Ear: External ear normal.     Mouth/Throat:     Mouth: Mucous membranes are moist.     Pharynx: Oropharynx is clear. No oropharyngeal exudate or posterior oropharyngeal erythema.  Eyes:     General: No scleral icterus.       Right eye: No discharge.        Left eye: No discharge.     Extraocular Movements: Extraocular movements intact.     Conjunctiva/sclera: Conjunctivae normal.     Pupils: Pupils are equal, round, and reactive to light.  Cardiovascular:  Rate and Rhythm: Normal rate and regular rhythm.     Pulses:          Dorsalis pedis pulses are 1+ on the right side and 1+ on the left side.       Posterior tibial pulses are 1+ on the right side and 1+ on the left side.  Pulmonary:     Effort: Pulmonary effort is normal. No respiratory distress.     Breath sounds: Normal breath sounds.  Abdominal:     General: Bowel sounds are normal.     Tenderness: There is no abdominal tenderness. There is no guarding.  Musculoskeletal:     Cervical back: No rigidity or tenderness.  Skin:    General: Skin is warm and dry.  Neurological:     Mental Status: He is alert and oriented to person, place, and time.  Psychiatric:        Mood and Affect: Mood normal.        Behavior: Behavior normal.     Diabetic Foot Exam  - Simple   Simple Foot Form Diabetic Foot exam was performed with the following findings: Yes 04/27/2023 11:24 AM  Visual Inspection See comments: Yes Sensation Testing Intact to touch and monofilament testing bilaterally: Yes Pulse Check Posterior Tibialis and Dorsalis pulse intact bilaterally: Yes Comments Feet are cavus bilaterally without lesions or rashes.     Results for orders placed or performed in visit on 04/27/23  CBC with Differential/Platelet  Result Value Ref Range   WBC 7.0 4.0 - 10.5 K/uL   RBC 5.40 4.22 - 5.81 Mil/uL   Hemoglobin 16.3 13.0 - 17.0 g/dL   HCT 13.2 44.0 - 10.2 %   MCV 92.1 78.0 - 100.0 fl   MCHC 32.9 30.0 - 36.0 g/dL   RDW 72.5 36.6 - 44.0 %   Platelets 189.0 150.0 - 400.0 K/uL   Neutrophils Relative % 58.9 43.0 - 77.0 %   Lymphocytes Relative 30.2 12.0 - 46.0 %   Monocytes Relative 8.0 3.0 - 12.0 %   Eosinophils Relative 1.9 0.0 - 5.0 %   Basophils Relative 1.0 0.0 - 3.0 %   Neutro Abs 4.1 1.4 - 7.7 K/uL   Lymphs Abs 2.1 0.7 - 4.0 K/uL   Monocytes Absolute 0.6 0.1 - 1.0 K/uL   Eosinophils Absolute 0.1 0.0 - 0.7 K/uL   Basophils Absolute 0.1 0.0 - 0.1 K/uL  Comprehensive metabolic panel  Result Value Ref Range   Sodium 136 135 - 145 mEq/L   Potassium 4.3 3.5 - 5.1 mEq/L   Chloride 100 96 - 112 mEq/L   CO2 27 19 - 32 mEq/L   Glucose, Bld 122 (H) 70 - 99 mg/dL   BUN 22 6 - 23 mg/dL   Creatinine, Ser 3.47 0.40 - 1.50 mg/dL   Total Bilirubin 1.2 0.2 - 1.2 mg/dL   Alkaline Phosphatase 44 39 - 117 U/L   AST 23 0 - 37 U/L   ALT 27 0 - 53 U/L   Total Protein 7.4 6.0 - 8.3 g/dL   Albumin 4.4 3.5 - 5.2 g/dL   GFR 42.59 >56.38 mL/min   Calcium 9.4 8.4 - 10.5 mg/dL  Hemoglobin V5I  Result Value Ref Range   Hgb A1c MFr Bld 6.0 4.6 - 6.5 %  Microalbumin / creatinine urine ratio  Result Value Ref Range   Microalb, Ur <0.7 0.0 - 1.9 mg/dL   Creatinine,U 433.2 mg/dL   Microalb Creat Ratio 0.5 0.0 - 30.0 mg/g  Lipid panel  Result Value Ref  Range   Cholesterol 116 0 - 200 mg/dL   Triglycerides 161.0 (H) 0.0 - 149.0 mg/dL   HDL 96.04 (L) >54.09 mg/dL   VLDL 81.1 0.0 - 91.4 mg/dL   LDL Cholesterol 51 0 - 99 mg/dL   Total CHOL/HDL Ratio 4    NonHDL 84.40   PSA  Result Value Ref Range   PSA 1.35 0.10 - 4.00 ng/mL      The ASCVD Risk score (Arnett DK, et al., 2019) failed to calculate for the following reasons:   The valid total cholesterol range is 130 to 320 mg/dL    Assessment & Plan:   Type 2 diabetes mellitus without complication, without long-term current use of insulin (HCC) -     Comprehensive metabolic panel -     Hemoglobin A1c -     Microalbumin / creatinine urine ratio  Essential hypertension -     CBC with Differential/Platelet -     Comprehensive metabolic panel -     Microalbumin / creatinine urine ratio  Elevated LDL cholesterol level -     Comprehensive metabolic panel -     Lipid panel  Screening for prostate cancer -     PSA  Need for immunization against influenza -     Flu Vaccine Trivalent High Dose (Fluad)  Screening for colon cancer -     Ambulatory referral to Gastroenterology    Return in about 6 months (around 10/26/2023), or if symptoms worsen or fail to improve.    Mliss Sax, MD

## 2023-05-11 ENCOUNTER — Encounter: Payer: Self-pay | Admitting: Family Medicine

## 2023-05-12 NOTE — Addendum Note (Signed)
Addended by: Andrez Grime on: 05/12/2023 10:17 AM   Modules accepted: Orders

## 2023-05-13 ENCOUNTER — Encounter: Payer: Self-pay | Admitting: Internal Medicine

## 2023-06-25 ENCOUNTER — Other Ambulatory Visit: Payer: Self-pay | Admitting: Family

## 2023-06-25 DIAGNOSIS — R030 Elevated blood-pressure reading, without diagnosis of hypertension: Secondary | ICD-10-CM

## 2023-06-25 DIAGNOSIS — E119 Type 2 diabetes mellitus without complications: Secondary | ICD-10-CM

## 2023-06-29 ENCOUNTER — Ambulatory Visit (AMBULATORY_SURGERY_CENTER): Payer: Medicare Other

## 2023-06-29 VITALS — Ht 71.0 in | Wt 228.0 lb

## 2023-06-29 DIAGNOSIS — Z1211 Encounter for screening for malignant neoplasm of colon: Secondary | ICD-10-CM

## 2023-06-29 NOTE — Progress Notes (Signed)

## 2023-07-02 ENCOUNTER — Other Ambulatory Visit: Payer: Self-pay

## 2023-07-02 DIAGNOSIS — R030 Elevated blood-pressure reading, without diagnosis of hypertension: Secondary | ICD-10-CM

## 2023-07-02 DIAGNOSIS — E119 Type 2 diabetes mellitus without complications: Secondary | ICD-10-CM

## 2023-07-02 MED ORDER — TRANDOLAPRIL 1 MG PO TABS: ORAL_TABLET | ORAL | 2 refills | Status: DC

## 2023-07-10 DIAGNOSIS — H43811 Vitreous degeneration, right eye: Secondary | ICD-10-CM | POA: Diagnosis not present

## 2023-07-10 DIAGNOSIS — H43822 Vitreomacular adhesion, left eye: Secondary | ICD-10-CM | POA: Diagnosis not present

## 2023-07-10 DIAGNOSIS — E113312 Type 2 diabetes mellitus with moderate nonproliferative diabetic retinopathy with macular edema, left eye: Secondary | ICD-10-CM | POA: Diagnosis not present

## 2023-07-10 DIAGNOSIS — E113211 Type 2 diabetes mellitus with mild nonproliferative diabetic retinopathy with macular edema, right eye: Secondary | ICD-10-CM | POA: Diagnosis not present

## 2023-07-17 ENCOUNTER — Encounter: Payer: Self-pay | Admitting: Internal Medicine

## 2023-07-20 NOTE — Progress Notes (Unsigned)
[facility] History and Physical   Primary Care Physician:  Mliss Sax, MD   Reason for Procedure:   CRCA screening  Plan:    colonoscopy     HPI: Norman Bradley is a 73 y.o. male presents for a screening colonoscopy - last exam 2009 w/ internal hemorrhoids   Past Medical History:  Diagnosis Date   Cataract    Diabetes mellitus without complication (HCC)    Hyperlipidemia     Past Surgical History:  Procedure Laterality Date   STRABISMUS SURGERY      Prior to Admission medications   Medication Sig Start Date End Date Taking? Authorizing Provider  betamethasone dipropionate 0.05 % lotion Apply 1 Application topically at bedtime. 11/30/21   [provider]  fluocinonide cream (LIDEX) 0.05 % Apply topically 2 (two) times daily. Patient not taking: Reported on 06/29/2023 03/19/22   [provider]  metFORMIN (GLUCOPHAGE) 1000 MG tablet TAKE 1 TABLET(1000 MG) BY MOUTH TWICE DAILY WITH A MEAL 01/15/23   Mliss Sax, MD  mometasone (ELOCON) 0.1 % cream SMARTSIG:sparingly Topical Daily 03/19/22   [provider]  pravastatin (PRAVACHOL) 20 MG tablet TAKE 1 TABLET(20 MG) BY MOUTH DAILY 11/10/22   Mliss Sax, MD  Semaglutide (RYBELSUS) 14 MG TABS Take 1 tablet (14 mg total) by mouth daily. 04/23/23   Mliss Sax, MD  trandolapril (MAVIK) 1 MG tablet TAKE 1 TABLET(1 MG) BY MOUTH DAILY 07/02/23   Mliss Sax, MD    Current Outpatient Medications  Medication Sig Dispense Refill   betamethasone dipropionate 0.05 % lotion Apply 1 Application topically at bedtime.     fluocinonide cream (LIDEX) 0.05 % Apply topically 2 (two) times daily. (Patient not taking: Reported on 06/29/2023)     metFORMIN (GLUCOPHAGE) 1000 MG tablet TAKE 1 TABLET(1000 MG) BY MOUTH TWICE DAILY WITH A MEAL 180 tablet 0   mometasone (ELOCON) 0.1 % cream SMARTSIG:sparingly Topical Daily     pravastatin (PRAVACHOL) 20 MG  tablet TAKE 1 TABLET(20 MG) BY MOUTH DAILY 90 tablet 3   Semaglutide (RYBELSUS) 14 MG TABS Take 1 tablet (14 mg total) by mouth daily. 90 tablet 0   trandolapril (MAVIK) 1 MG tablet TAKE 1 TABLET(1 MG) BY MOUTH DAILY 90 tablet 2   No current facility-administered medications for this visit.    Allergies as of 07/21/2023   (No Known Allergies)    Family History  Problem Relation Age of Onset   Alcohol abuse Mother    Cancer Mother    Depression Mother    Early death Mother    Cancer Brother        leukemia   Diabetes Maternal Grandmother    Diabetes Maternal Grandfather    Hypothyroidism Daughter    Colon cancer Son    Colon polyps Neg Hx    Esophageal cancer Neg Hx    Rectal cancer Neg Hx    Stomach cancer Neg Hx     Social History   Socioeconomic History   Marital status: Married    Spouse name: Not on file   Number of children: 7   Years of education: Not on file   Highest education level: Not on file  Occupational History   Not on file  Tobacco Use   Smoking status: Some Days    Types: Cigars   Smokeless tobacco: Never  Vaping Use   Vaping status: Never Used  Substance and Sexual Activity   Alcohol use: Yes  Comment: 3-4 glasses per wine once per week   Drug use: No   Sexual activity: Yes    Partners: Female  Other Topics Concern   Not on file  Social History Narrative   left handed   Live with spouse   Social Drivers of Health   Financial Resource Strain: Low Risk  (04/27/2023)   Overall Financial Resource Strain (CARDIA)    Difficulty of Paying Living Expenses: Not hard at all  Food Insecurity: No Food Insecurity (04/27/2023)   Hunger Vital Sign    Worried About Running Out of Food in the Last Year: Never true    Ran Out of Food in the Last Year: Never true  Transportation Needs: No Transportation Needs (04/27/2023)   PRAPARE - Administrator, Civil Service (Medical): No    Lack of Transportation (Non-Medical): No  Physical  Activity: Unknown (04/27/2023)   Exercise Vital Sign    Days of Exercise per Week: 3 days    Minutes of Exercise per Session: Not on file  Stress: No Stress Concern Present (03/04/2022)   [employer] of Occupational Health - Occupational Stress Questionnaire    Feeling of Stress : Not at all  Social Connections: Unknown (04/27/2023)   Social Connection and Isolation Panel [NHANES]    Frequency of Communication with Friends and Family: Once a week    Frequency of Social Gatherings with Friends and Family: Once a week    Attends Religious Services: More than 4 times per year    Active Member of [employer] or Organizations: Patient declined    Attends Banker Meetings: Not on file    Marital Status: Married  Intimate Partner Violence: Not At Risk (03/04/2022)   Humiliation, Afraid, Rape, and Kick questionnaire    Fear of Current or Ex-Partner: No    Emotionally Abused: No    Physically Abused: No    Sexually Abused: No    Review of Systems: Positive for *** All other review of systems negative except as mentioned in the HPI.  Physical Exam: Vital signs There were no vitals taken for this visit.  General:   Alert,  Well-developed, well-nourished, pleasant and cooperative in NAD Lungs:  Clear throughout to auscultation.   Heart:  Regular rate and rhythm; no murmurs, clicks, rubs,  or gallops. Abdomen:  Soft, nontender and nondistended. Normal bowel sounds.   Neuro/Psych:  Alert and cooperative. Normal mood and affect. A and O x 3   @Toussaint Golson  Sena Slate, MD, [facility] [phone] (pager) 07/20/2023 6:05 PM@

## 2023-07-21 ENCOUNTER — Encounter: Payer: Self-pay | Admitting: Internal Medicine

## 2023-07-21 ENCOUNTER — Ambulatory Visit: Payer: Medicare Other | Admitting: Internal Medicine

## 2023-07-21 VITALS — BP 126/81 | HR 73 | Temp 97.9°F | Resp 16 | Ht 70.0 in | Wt 228.0 lb

## 2023-07-21 DIAGNOSIS — D128 Benign neoplasm of rectum: Secondary | ICD-10-CM

## 2023-07-21 DIAGNOSIS — E785 Hyperlipidemia, unspecified: Secondary | ICD-10-CM | POA: Diagnosis not present

## 2023-07-21 DIAGNOSIS — D123 Benign neoplasm of transverse colon: Secondary | ICD-10-CM | POA: Diagnosis not present

## 2023-07-21 DIAGNOSIS — K621 Rectal polyp: Secondary | ICD-10-CM | POA: Diagnosis not present

## 2023-07-21 DIAGNOSIS — E119 Type 2 diabetes mellitus without complications: Secondary | ICD-10-CM | POA: Diagnosis not present

## 2023-07-21 DIAGNOSIS — Z1211 Encounter for screening for malignant neoplasm of colon: Secondary | ICD-10-CM | POA: Diagnosis not present

## 2023-07-21 DIAGNOSIS — D120 Benign neoplasm of cecum: Secondary | ICD-10-CM

## 2023-07-21 DIAGNOSIS — K635 Polyp of colon: Secondary | ICD-10-CM | POA: Diagnosis not present

## 2023-07-21 DIAGNOSIS — D122 Benign neoplasm of ascending colon: Secondary | ICD-10-CM | POA: Diagnosis not present

## 2023-07-21 DIAGNOSIS — K5730 Diverticulosis of large intestine without perforation or abscess without bleeding: Secondary | ICD-10-CM | POA: Diagnosis not present

## 2023-07-21 DIAGNOSIS — D125 Benign neoplasm of sigmoid colon: Secondary | ICD-10-CM | POA: Diagnosis not present

## 2023-07-21 DIAGNOSIS — K648 Other hemorrhoids: Secondary | ICD-10-CM

## 2023-07-21 DIAGNOSIS — D127 Benign neoplasm of rectosigmoid junction: Secondary | ICD-10-CM | POA: Diagnosis not present

## 2023-07-21 HISTORY — PX: COLONOSCOPY WITH PROPOFOL: SHX5780

## 2023-07-21 MED ORDER — SODIUM CHLORIDE 0.9 % IV SOLN: 500.0000 mL | Freq: Once | INTRAVENOUS | Status: DC

## 2023-07-21 NOTE — Progress Notes (Signed)
Called to room to assist during endoscopic procedure.  Patient ID and intended procedure confirmed with present staff. Received instructions for my participation in the procedure from the performing physician.  

## 2023-07-21 NOTE — Progress Notes (Signed)
Sedate, gd SR, tolerated procedure well, VSS, report to RN 

## 2023-07-21 NOTE — Op Note (Signed)
[facility] Patient Name: Norman Bradley Procedure Date: 07/21/2023 8:52 AM MRN: [id number] Endoscopist: Iva Boop , MD, [id number] Age: 73 Referring MD:  Date of Birth: [birth year] Gender: Male Account #: [id number] Procedure:                Colonoscopy Indications:              Screening for colorectal malignant neoplasm, Last                            colonoscopy: 2009 Medicines:                Monitored Anesthesia Care Procedure:                Pre-Anesthesia Assessment:                           - Prior to the procedure, a History and Physical                            was performed, and patient medications and                            allergies were reviewed. The patient's tolerance of                            previous anesthesia was also reviewed. The risks                            and benefits of the procedure and the sedation                            options and risks were discussed with the patient.                            All questions were answered, and informed consent                            was obtained. Prior Anticoagulants: The patient has                            taken no anticoagulant or antiplatelet agents. ASA                            Grade Assessment: II - A patient with mild systemic                            disease. After reviewing the risks and benefits,                            the patient was deemed in satisfactory condition to                            undergo the procedure.  After obtaining informed consent, the colonoscope                            was passed under direct vision. Throughout the                            procedure, the patient's blood pressure, pulse, and                            oxygen saturations were monitored continuously. The                            CF [id number] #[id number] was introduced through the anus                            and advanced to the the cecum,  identified by                            appendiceal orifice and ileocecal valve. The                            colonoscopy was performed without difficulty. The                            patient tolerated the procedure well. The quality                            of the bowel preparation was adequate. The bowel                            preparation used was Miralax via split dose                            instruction. The ileocecal valve, appendiceal                            orifice, and rectum were photographed. Scope In: 9:10:32 AM Scope Out: 9:36:43 AM Scope Withdrawal Time: 0 hours 21 minutes 18 seconds  Total Procedure Duration: 0 hours 26 minutes 11 seconds  Findings:                 The perianal and digital rectal examinations were                            normal. Pertinent negatives include normal prostate                            (size, shape, and consistency).                           Eleven(11) sessile polyps were found in the rectum,                            sigmoid colon, transverse colon, ascending colon  and cecum. The polyps were 3 to 10 mm in size.                            These polyps were removed with a cold snare.                            Resection and retrieval were complete. Verification                            of patient identification for the specimen was                            done. Estimated blood loss was minimal.                           Multiple diverticula were found in the left colon.                           Internal hemorrhoids were found.                           The exam was otherwise without abnormality on                            direct and retroflexion views. Complications:            No immediate complications. Estimated Blood Loss:     Estimated blood loss was minimal. Impression:               - Eleven(11) 3 to 10 mm polyps in the rectum, in                            the sigmoid colon, in  the transverse colon, in the                            ascending colon and in the cecum, removed with a                            cold snare. Resected and retrieved.                           - Diverticulosis in the left colon.                           - Internal hemorrhoids.                           - The examination was otherwise normal on direct                            and retroflexion views. Recommendation:           - Patient has a contact number available for  emergencies. The signs and symptoms of potential                            delayed complications were discussed with the                            patient. Return to normal activities tomorrow.                            Written discharge instructions were provided to the                            patient.                           - Resume previous diet.                           - Continue present medications.                           - Repeat colonoscopy is recommended. The                            colonoscopy date will be determined after pathology                            results from today's exam become available for                            review. Iva Boop, MD 07/21/2023 9:50:06 AM This report has been signed electronically.

## 2023-07-21 NOTE — Progress Notes (Signed)
Pt's states no medical or surgical changes since previsit or office visit. 

## 2023-07-21 NOTE — Patient Instructions (Addendum)
Eleven polyps removed today. All look benign. I will let you know pathology results and when to have another routine colonoscopy by mail and/or My Chart.  You also have a condition called diverticulosis - common and not usually a problem. Please read the handout provided.  Internal hemorrhoids seen again also (we actually all have hemorrhoids - yours were a little swollen which is common with the prep.  I appreciate the opportunity to care for you. Iva Boop, MD, FACG   YOU HAD AN ENDOSCOPIC PROCEDURE TODAY AT THE [facility]:   Refer to the procedure report that was given to you for any specific questions about what was found during the examination.  If the procedure report does not answer your questions, please call your gastroenterologist to clarify.  If you requested that your care partner not be given the details of your procedure findings, then the procedure report has been included in a sealed envelope for you to review at your convenience later.  YOU SHOULD EXPECT: Some feelings of bloating in the abdomen. Passage of more gas than usual.  Walking can help get rid of the air that was put into your GI tract during the procedure and reduce the bloating. If you had a lower endoscopy (such as a colonoscopy or flexible sigmoidoscopy) you may notice spotting of blood in your stool or on the toilet paper. If you underwent a bowel prep for your procedure, you may not have a normal bowel movement for a few days.  Please Note:  You might notice some irritation and congestion in your nose or some drainage.  This is from the oxygen used during your procedure.  There is no need for concern and it should clear up in a day or so.  SYMPTOMS TO REPORT IMMEDIATELY:  Following lower endoscopy (colonoscopy or flexible sigmoidoscopy):  Excessive amounts of blood in the stool  Significant tenderness or worsening of abdominal pains  Swelling of the abdomen that is new, acute  Fever of 100F  or higher  For urgent or emergent issues, a gastroenterologist can be reached at any hour by calling [phone]. Do not use MyChart messaging for urgent concerns.    DIET:  We do recommend a small meal at first, but then you may proceed to your regular diet.  Drink plenty of fluids but you should avoid alcoholic beverages for 24 hours.  ACTIVITY:  You should plan to take it easy for the rest of today and you should NOT DRIVE or use heavy machinery until tomorrow (because of the sedation medicines used during the test).    FOLLOW UP: Our staff will call the number listed on your records the next business day following your procedure.  We will call around 7:15- 8:00 am to check on you and address any questions or concerns that you may have regarding the information given to you following your procedure. If we do not reach you, we will leave a message.     If any biopsies were taken you will be contacted by phone or by letter within the next 1-3 weeks.  Please call us at [phone] if you have not heard about the biopsies in 3 weeks.    SIGNATURES/CONFIDENTIALITY: You and/or your care partner have signed paperwork which will be entered into your electronic medical record.  These signatures attest to the fact that that the information above on your After Visit Summary has been reviewed and is understood.  Full responsibility of the confidentiality  of this discharge information lies with you and/or your care-partner.

## 2023-07-22 ENCOUNTER — Telehealth: Payer: Self-pay

## 2023-07-22 NOTE — Telephone Encounter (Signed)
  Follow up Call-     07/21/2023    8:05 AM  Call back number  Post procedure Call Back phone  # [phone]  Permission to leave phone message Yes     Patient questions:  Do you have a fever, pain , or abdominal swelling? No. Pain Score  0 *  Have you tolerated food without any problems? Yes.    Have you been able to return to your normal activities? Yes.    Do you have any questions about your discharge instructions: Diet   No. Medications  No. Follow up visit  No.  Do you have questions or concerns about your Care? No.  Actions: * If pain score is 4 or above: No action needed, pain <4.

## 2023-07-23 ENCOUNTER — Encounter: Payer: Self-pay | Admitting: Internal Medicine

## 2023-07-23 DIAGNOSIS — Z860101 Personal history of adenomatous and serrated colon polyps: Secondary | ICD-10-CM

## 2023-07-23 HISTORY — DX: Personal history of adenomatous and serrated colon polyps: Z86.0101

## 2023-07-23 LAB — SURGICAL PATHOLOGY

## 2023-07-25 ENCOUNTER — Other Ambulatory Visit: Payer: Self-pay | Admitting: Family Medicine

## 2023-07-25 DIAGNOSIS — E119 Type 2 diabetes mellitus without complications: Secondary | ICD-10-CM

## 2023-07-27 MED ORDER — RYBELSUS 14 MG PO TABS: 14.0000 mg | ORAL_TABLET | Freq: Every day | ORAL | 0 refills | Status: DC

## 2023-08-10 DIAGNOSIS — E113312 Type 2 diabetes mellitus with moderate nonproliferative diabetic retinopathy with macular edema, left eye: Secondary | ICD-10-CM | POA: Diagnosis not present

## 2023-09-07 DIAGNOSIS — E113312 Type 2 diabetes mellitus with moderate nonproliferative diabetic retinopathy with macular edema, left eye: Secondary | ICD-10-CM | POA: Diagnosis not present

## 2023-10-13 DIAGNOSIS — H43822 Vitreomacular adhesion, left eye: Secondary | ICD-10-CM | POA: Diagnosis not present

## 2023-10-13 DIAGNOSIS — H43811 Vitreous degeneration, right eye: Secondary | ICD-10-CM | POA: Diagnosis not present

## 2023-10-13 DIAGNOSIS — E113312 Type 2 diabetes mellitus with moderate nonproliferative diabetic retinopathy with macular edema, left eye: Secondary | ICD-10-CM | POA: Diagnosis not present

## 2023-10-13 DIAGNOSIS — Z961 Presence of intraocular lens: Secondary | ICD-10-CM | POA: Diagnosis not present

## 2023-10-13 DIAGNOSIS — E113211 Type 2 diabetes mellitus with mild nonproliferative diabetic retinopathy with macular edema, right eye: Secondary | ICD-10-CM | POA: Diagnosis not present

## 2023-10-21 ENCOUNTER — Other Ambulatory Visit: Payer: Self-pay | Admitting: Family Medicine

## 2023-10-21 DIAGNOSIS — E119 Type 2 diabetes mellitus without complications: Secondary | ICD-10-CM

## 2023-10-21 DIAGNOSIS — E7800 Pure hypercholesterolemia, unspecified: Secondary | ICD-10-CM

## 2023-10-23 ENCOUNTER — Ambulatory Visit: Payer: Medicare Other

## 2023-10-26 ENCOUNTER — Ambulatory Visit: Payer: Medicare Other | Admitting: Family Medicine

## 2023-10-27 ENCOUNTER — Other Ambulatory Visit: Payer: Self-pay | Admitting: Family Medicine

## 2023-10-27 DIAGNOSIS — E119 Type 2 diabetes mellitus without complications: Secondary | ICD-10-CM

## 2023-10-28 MED ORDER — METFORMIN HCL 1000 MG PO TABS: ORAL_TABLET | ORAL | 0 refills | Status: DC

## 2023-10-28 NOTE — Telephone Encounter (Signed)
 Requesting: metFORMIN  (GLUCOPHAGE ) 1000 MG tablet  Last Visit: 04/27/2023 Next Visit: 11/02/2023 Last Refill: 01/15/2023  Please Advise

## 2023-11-02 ENCOUNTER — Encounter: Payer: Self-pay | Admitting: Family Medicine

## 2023-11-02 ENCOUNTER — Ambulatory Visit (INDEPENDENT_AMBULATORY_CARE_PROVIDER_SITE_OTHER): Admitting: Family Medicine

## 2023-11-02 VITALS — BP 114/72 | HR 89 | Temp 98.0°F | Ht 70.0 in | Wt 231.2 lb

## 2023-11-02 DIAGNOSIS — E7800 Pure hypercholesterolemia, unspecified: Secondary | ICD-10-CM

## 2023-11-02 DIAGNOSIS — E1149 Type 2 diabetes mellitus with other diabetic neurological complication: Secondary | ICD-10-CM

## 2023-11-02 DIAGNOSIS — E0849 Diabetes mellitus due to underlying condition with other diabetic neurological complication: Secondary | ICD-10-CM | POA: Insufficient documentation

## 2023-11-02 DIAGNOSIS — E119 Type 2 diabetes mellitus without complications: Secondary | ICD-10-CM | POA: Diagnosis not present

## 2023-11-02 LAB — COMPREHENSIVE METABOLIC PANEL WITH GFR
ALT: 30 U/L (ref 0–53)
AST: 20 U/L (ref 0–37)
Albumin: 4.1 g/dL (ref 3.5–5.2)
Alkaline Phosphatase: 45 U/L (ref 39–117)
BUN: 18 mg/dL (ref 6–23)
CO2: 27 meq/L (ref 19–32)
Calcium: 9.2 mg/dL (ref 8.4–10.5)
Chloride: 101 meq/L (ref 96–112)
Creatinine, Ser: 1.12 mg/dL (ref 0.40–1.50)
GFR: 65.6 mL/min (ref >60.00)
Glucose, Bld: 127 mg/dL — ABNORMAL HIGH (ref 70–99)
Potassium: 4.1 meq/L (ref 3.5–5.1)
Sodium: 137 meq/L (ref 135–145)
Total Bilirubin: 1.1 mg/dL (ref 0.2–1.2)
Total Protein: 6.8 g/dL (ref 6.0–8.3)

## 2023-11-02 LAB — MICROALBUMIN / CREATININE URINE RATIO
Creatinine,U: 98.6 mg/dL
Microalb Creat Ratio: UNDETERMINED mg/g (ref 0.0–30.0)
Microalb, Ur: <0.7 mg/dL

## 2023-11-02 LAB — HEMOGLOBIN A1C: Hgb A1c MFr Bld: 5.9 % (ref 4.6–6.5)

## 2023-11-02 LAB — LDL CHOLESTEROL, DIRECT: Direct LDL: 54 mg/dL

## 2023-11-02 NOTE — Progress Notes (Signed)
 Established Patient Office Visit   Subjective:  Patient ID: Norman Bradley, male    DOB: 22-Aug-1950  Age: 73 y.o. MRN: [id number]  Chief Complaint  Patient presents with   Medical Management of Chronic Issues    6 month follow up.     HPI Encounter Diagnoses  Name Primary?   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  Adjusting to retirement.  Exercising intermittently.  Continues with the low medicines.   Review of Systems  Constitutional: Negative.   HENT: Negative.    Eyes:  Negative for blurred vision, discharge and redness.  Respiratory: Negative.    Cardiovascular: Negative.   Gastrointestinal:  Negative for abdominal pain.  Genitourinary: Negative.   Musculoskeletal: Negative.  Negative for myalgias.  Skin:  Negative for rash.  Neurological:  Negative for tingling, loss of consciousness and weakness.  Endo/Heme/Allergies:  Negative for polydipsia.     Current Outpatient Medications:    betamethasone dipropionate 0.05 % lotion, Apply 1 Application topically at bedtime., Disp: , Rfl:    fluocinonide cream (LIDEX) 0.05 %, Apply topically 2 (two) times daily., Disp: , Rfl:    metFORMIN  (GLUCOPHAGE ) 1000 MG tablet, TAKE 1 TABLET(1000 MG) BY MOUTH TWICE DAILY WITH A MEAL, Disp: 180 tablet, Rfl: 0   mometasone (ELOCON) 0.1 % cream, SMARTSIG:sparingly Topical Daily, Disp: , Rfl:    pravastatin  (PRAVACHOL ) 20 MG tablet, TAKE 1 TABLET(20 MG) BY MOUTH DAILY, Disp: 90 tablet, Rfl: 3   RYBELSUS  14 MG TABS, TAKE 1 TABLET(14 MG) BY MOUTH DAILY, Disp: 90 tablet, Rfl: 0   trandolapril  (MAVIK ) 1 MG tablet, TAKE 1 TABLET(1 MG) BY MOUTH DAILY, Disp: 90 tablet, Rfl: 2   Objective:     BP 114/72 (BP Location: Right Arm, Patient Position: Sitting, Cuff Size: Normal)   Pulse 89   Temp 98 F  (36.7 C) (Temporal)   Ht 5\' 10"  (1.778 m)   Wt 231 lb 3.2 oz (104.9 kg)   SpO2 96%   BMI 33.17 kg/m  Wt Readings from Last 3 Encounters:  11/02/23 231 lb 3.2 oz (104.9 kg)  07/21/23 228 lb (103.4 kg)  06/29/23 228 lb (103.4 kg)      Physical Exam Constitutional:      General: He is not in acute distress.    Appearance: Normal appearance. He is not ill-appearing, toxic-appearing or diaphoretic.  HENT:     Head: Normocephalic and atraumatic.     Right Ear: External ear normal.     Left Ear: External ear normal.  Eyes:     General: No scleral icterus.       Right eye: No discharge.        Left eye: No discharge.     Extraocular Movements: Extraocular movements intact.     Conjunctiva/sclera: Conjunctivae normal.  Pulmonary:     Effort: Pulmonary effort is normal. No respiratory distress.  Skin:    General: Skin is warm and dry.  Neurological:     Mental Status: He is alert and oriented to person, place, and time.  Psychiatric:        Mood and Affect: Mood normal.        Behavior: Behavior normal.      No results found for any visits on 11/02/23.    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  Information was given on exercising to lose weight.  Continue all medications as above.  Adjustments made pending results of labs  Tonna Frederic, MD

## 2023-11-10 DIAGNOSIS — E113312 Type 2 diabetes mellitus with moderate nonproliferative diabetic retinopathy with macular edema, left eye: Secondary | ICD-10-CM | POA: Diagnosis not present

## 2023-11-23 ENCOUNTER — Encounter: Payer: Self-pay | Admitting: Family Medicine

## 2023-12-08 DIAGNOSIS — E113312 Type 2 diabetes mellitus with moderate nonproliferative diabetic retinopathy with macular edema, left eye: Secondary | ICD-10-CM | POA: Diagnosis not present

## 2024-01-05 DIAGNOSIS — E113312 Type 2 diabetes mellitus with moderate nonproliferative diabetic retinopathy with macular edema, left eye: Secondary | ICD-10-CM | POA: Diagnosis not present

## 2024-01-05 DIAGNOSIS — E113211 Type 2 diabetes mellitus with mild nonproliferative diabetic retinopathy with macular edema, right eye: Secondary | ICD-10-CM | POA: Diagnosis not present

## 2024-01-05 DIAGNOSIS — H43811 Vitreous degeneration, right eye: Secondary | ICD-10-CM | POA: Diagnosis not present

## 2024-01-05 DIAGNOSIS — Z961 Presence of intraocular lens: Secondary | ICD-10-CM | POA: Diagnosis not present

## 2024-01-05 DIAGNOSIS — H43822 Vitreomacular adhesion, left eye: Secondary | ICD-10-CM | POA: Diagnosis not present

## 2024-01-25 ENCOUNTER — Other Ambulatory Visit: Payer: Self-pay | Admitting: Family Medicine

## 2024-01-25 DIAGNOSIS — E119 Type 2 diabetes mellitus without complications: Secondary | ICD-10-CM

## 2024-02-08 ENCOUNTER — Encounter: Payer: Self-pay | Admitting: Family Medicine

## 2024-02-09 ENCOUNTER — Other Ambulatory Visit: Payer: Self-pay

## 2024-02-09 DIAGNOSIS — E119 Type 2 diabetes mellitus without complications: Secondary | ICD-10-CM

## 2024-02-09 MED ORDER — RYBELSUS 14 MG PO TABS: 1.0000 | ORAL_TABLET | Freq: Every day | ORAL | 0 refills | Status: DC

## 2024-02-18 LAB — HM DIABETES EYE EXAM

## 2024-02-22 DIAGNOSIS — E113312 Type 2 diabetes mellitus with moderate nonproliferative diabetic retinopathy with macular edema, left eye: Secondary | ICD-10-CM | POA: Diagnosis not present

## 2024-04-18 DIAGNOSIS — E113312 Type 2 diabetes mellitus with moderate nonproliferative diabetic retinopathy with macular edema, left eye: Secondary | ICD-10-CM | POA: Diagnosis not present

## 2024-04-25 DIAGNOSIS — L812 Freckles: Secondary | ICD-10-CM | POA: Diagnosis not present

## 2024-04-25 DIAGNOSIS — D225 Melanocytic nevi of trunk: Secondary | ICD-10-CM | POA: Diagnosis not present

## 2024-04-25 DIAGNOSIS — L304 Erythema intertrigo: Secondary | ICD-10-CM | POA: Diagnosis not present

## 2024-04-25 DIAGNOSIS — D1801 Hemangioma of skin and subcutaneous tissue: Secondary | ICD-10-CM | POA: Diagnosis not present

## 2024-04-25 DIAGNOSIS — L218 Other seborrheic dermatitis: Secondary | ICD-10-CM | POA: Diagnosis not present

## 2024-04-25 DIAGNOSIS — L821 Other seborrheic keratosis: Secondary | ICD-10-CM | POA: Diagnosis not present

## 2024-05-02 ENCOUNTER — Other Ambulatory Visit: Payer: Self-pay | Admitting: Family Medicine

## 2024-05-02 DIAGNOSIS — E119 Type 2 diabetes mellitus without complications: Secondary | ICD-10-CM

## 2024-05-04 ENCOUNTER — Other Ambulatory Visit: Payer: Self-pay | Admitting: Family Medicine

## 2024-05-04 DIAGNOSIS — E119 Type 2 diabetes mellitus without complications: Secondary | ICD-10-CM

## 2024-05-04 DIAGNOSIS — R030 Elevated blood-pressure reading, without diagnosis of hypertension: Secondary | ICD-10-CM

## 2024-05-05 ENCOUNTER — Ambulatory Visit: Admitting: Family Medicine

## 2024-05-05 ENCOUNTER — Ambulatory Visit: Payer: Self-pay | Admitting: Family Medicine

## 2024-05-05 ENCOUNTER — Encounter: Payer: Self-pay | Admitting: Family Medicine

## 2024-05-05 VITALS — BP 116/80 | HR 96 | Temp 97.2°F | Ht 70.0 in | Wt 228.2 lb

## 2024-05-05 DIAGNOSIS — Z125 Encounter for screening for malignant neoplasm of prostate: Secondary | ICD-10-CM

## 2024-05-05 DIAGNOSIS — E7800 Pure hypercholesterolemia, unspecified: Secondary | ICD-10-CM | POA: Diagnosis not present

## 2024-05-05 DIAGNOSIS — Z23 Encounter for immunization: Secondary | ICD-10-CM

## 2024-05-05 DIAGNOSIS — Z7984 Long term (current) use of oral hypoglycemic drugs: Secondary | ICD-10-CM

## 2024-05-05 DIAGNOSIS — I10 Essential (primary) hypertension: Secondary | ICD-10-CM

## 2024-05-05 DIAGNOSIS — E119 Type 2 diabetes mellitus without complications: Secondary | ICD-10-CM | POA: Diagnosis not present

## 2024-05-05 LAB — HEMOGLOBIN A1C: Hgb A1c MFr Bld: 6 % (ref 4.6–6.5)

## 2024-05-05 LAB — CBC
HCT: 46.9 % (ref 39.0–52.0)
Hemoglobin: 15.9 g/dL (ref 13.0–17.0)
MCHC: 34 g/dL (ref 30.0–36.0)
MCV: 88.8 fl (ref 78.0–100.0)
Platelets: 185 K/uL (ref 150.0–400.0)
RBC: 5.28 Mil/uL (ref 4.22–5.81)
RDW: 13.6 % (ref 11.5–15.5)
WBC: 7.3 K/uL (ref 4.0–10.5)

## 2024-05-05 LAB — COMPREHENSIVE METABOLIC PANEL WITH GFR
ALT: 25 U/L (ref 0–53)
AST: 20 U/L (ref 0–37)
Albumin: 4.2 g/dL (ref 3.5–5.2)
Alkaline Phosphatase: 45 U/L (ref 39–117)
BUN: 22 mg/dL (ref 6–23)
CO2: 26 meq/L (ref 19–32)
Calcium: 9.2 mg/dL (ref 8.4–10.5)
Chloride: 101 meq/L (ref 96–112)
Creatinine, Ser: 1.2 mg/dL (ref 0.40–1.50)
GFR: 60.18 mL/min (ref >60.00)
Glucose, Bld: 120 mg/dL — ABNORMAL HIGH (ref 70–99)
Potassium: 4.2 meq/L (ref 3.5–5.1)
Sodium: 137 meq/L (ref 135–145)
Total Bilirubin: 1.2 mg/dL (ref 0.2–1.2)
Total Protein: 7.1 g/dL (ref 6.0–8.3)

## 2024-05-05 LAB — URINALYSIS, ROUTINE W REFLEX MICROSCOPIC
Bilirubin Urine: NEGATIVE
Hgb urine dipstick: NEGATIVE
Ketones, ur: NEGATIVE
Leukocytes,Ua: NEGATIVE
Nitrite: NEGATIVE
RBC / HPF: NONE SEEN (ref 0–?)
Specific Gravity, Urine: 1.01 (ref 1.000–1.030)
Total Protein, Urine: NEGATIVE
Urine Glucose: NEGATIVE
Urobilinogen, UA: 4 — AB (ref 0.0–1.0)
pH: 6 (ref 5.0–8.0)

## 2024-05-05 LAB — PSA: PSA: 1.43 ng/mL (ref 0.10–4.00)

## 2024-05-05 MED ORDER — RYBELSUS 14 MG PO TABS: 1.0000 | ORAL_TABLET | Freq: Every day | ORAL | 0 refills | Status: AC

## 2024-05-05 NOTE — Progress Notes (Addendum)
 Established Patient Office Visit   Subjective:  Patient ID: Norman Bradley, male    DOB: 1951-01-26  Age: 73 y.o. MRN: [id number]  Chief Complaint  Patient presents with   Medical Management of Chronic Issues    6 month follow up. Pt is fasting. Pt has questions about Rybelsus  and if there is an simpler option is Tirzepitide.     HPI Encounter Diagnoses  Name Primary?   Essential hypertension Yes   Type 2 diabetes mellitus without complication, without long-term current use of insulin (HCC)    Elevated LDL cholesterol level    Screening for prostate cancer    Immunization due    Here for follow-up of above.  Continues on medications.  He is somewhat active by playing golf.  No regular exercise routine.  He did see the sleep medicine doctor but decided not to go through with the recommended sleep study.  He rarely ever smokes cigars.  Blood pressure currently controlled with Mavik .  Continues pravastatin  for elevated LDL cholesterol.  Continues metformin  and Rybelsus  for controlled type 2 diabetes.  Denies nausea diarrhea or constipation with the Rybelsus .  History of hepatic steatosis.   Review of Systems  Constitutional: Negative.   HENT: Negative.    Eyes:  Negative for blurred vision, discharge and redness.  Respiratory: Negative.    Cardiovascular: Negative.   Gastrointestinal:  Negative for abdominal pain.  Genitourinary: Negative.   Musculoskeletal: Negative.  Negative for myalgias.  Skin:  Negative for rash.  Neurological:  Negative for tingling, loss of consciousness and weakness.  Endo/Heme/Allergies:  Negative for polydipsia.      05/05/2024    9:02 AM 04/25/2022    1:59 PM 03/04/2022    8:26 AM  Depression screen PHQ 2/9  Decreased Interest 0 0 0  Down, Depressed, Hopeless 0 0 0  PHQ - 2 Score 0 0 0  Altered sleeping 0    Tired, decreased energy 0    Change in appetite 0    Feeling bad or failure about yourself  0    Trouble concentrating 0     Moving slowly or fidgety/restless 0    Suicidal thoughts 0    PHQ-9 Score 0    Difficult doing work/chores Not difficult at all        Current Outpatient Medications:    betamethasone dipropionate 0.05 % lotion, Apply 1 Application topically at bedtime., Disp: , Rfl:    fluocinonide cream (LIDEX) 0.05 %, Apply topically 2 (two) times daily., Disp: , Rfl:    metFORMIN  (GLUCOPHAGE ) 1000 MG tablet, TAKE 1 TABLET(1000 MG) BY MOUTH TWICE DAILY WITH A MEAL, Disp: 180 tablet, Rfl: 0   mometasone (ELOCON) 0.1 % cream, SMARTSIG:sparingly Topical Daily, Disp: , Rfl:    pravastatin  (PRAVACHOL ) 20 MG tablet, TAKE 1 TABLET(20 MG) BY MOUTH DAILY, Disp: 90 tablet, Rfl: 3   trandolapril  (MAVIK ) 1 MG tablet, TAKE 1 TABLET(1 MG) BY MOUTH DAILY, Disp: 90 tablet, Rfl: 2   Semaglutide  (RYBELSUS ) 14 MG TABS, Take 1 tablet (14 mg total) by mouth daily., Disp: 90 tablet, Rfl: 0   Objective:     BP 116/80 (BP Location: Right Arm, Patient Position: Sitting, Cuff Size: Normal)   Pulse 96   Temp (!) 97.2 F (36.2 C) (Temporal)   Ht 5' 10 (1.778 m)   Wt 228 lb 3.2 oz (103.5 kg)   SpO2 98%   BMI 32.74 kg/m    Physical Exam Constitutional:      General:  He is not in acute distress.    Appearance: Normal appearance. He is not ill-appearing, toxic-appearing or diaphoretic.  HENT:     Head: Normocephalic and atraumatic.     Right Ear: External ear normal.     Left Ear: External ear normal.     Mouth/Throat:     Mouth: Mucous membranes are moist.     Pharynx: Oropharynx is clear. No oropharyngeal exudate or posterior oropharyngeal erythema.  Eyes:     General: No scleral icterus.       Right eye: No discharge.        Left eye: No discharge.     Extraocular Movements: Extraocular movements intact.     Conjunctiva/sclera: Conjunctivae normal.     Pupils: Pupils are equal, round, and reactive to light.  Cardiovascular:     Rate and Rhythm: Normal rate and regular rhythm.     Pulses:           Dorsalis pedis pulses are 1+ on the right side and 1+ on the left side.       Posterior tibial pulses are 2+ on the right side and 1+ on the left side.  Pulmonary:     Effort: Pulmonary effort is normal. No respiratory distress.     Breath sounds: Normal breath sounds. No wheezing or rales.  Abdominal:     General: Bowel sounds are normal.  Musculoskeletal:     Cervical back: No rigidity or tenderness.  Lymphadenopathy:     Cervical: No cervical adenopathy.  Skin:    General: Skin is warm and dry.  Neurological:     Mental Status: He is alert and oriented to person, place, and time.  Psychiatric:        Mood and Affect: Mood normal.        Behavior: Behavior normal.    Diabetic Foot Exam - Simple   Simple Foot Form Diabetic Foot exam was performed with the following findings: Yes 05/05/2024  8:57 AM  Visual Inspection See comments: Yes Sensation Testing Intact to touch and monofilament testing bilaterally: Yes Pulse Check Posterior Tibialis and Dorsalis pulse intact bilaterally: Yes Comments Feet are cavus bilaterally.  There are no lesions or ulcerations.       No results found for any visits on 05/05/24.    The ASCVD Risk score (Arnett DK, et al., 2019) failed to calculate for the following reasons:   The valid total cholesterol range is 130 to 320 mg/dL    Assessment & Plan:   Essential hypertension -     Comprehensive metabolic panel with GFR -     CBC -     Urinalysis, Routine w reflex microscopic  Type 2 diabetes mellitus without complication, without long-term current use of insulin (HCC) -     Comprehensive metabolic panel with GFR -     Hemoglobin A1c -     Rybelsus ; Take 1 tablet (14 mg total) by mouth daily.  Dispense: 90 tablet; Refill: 0  Elevated LDL cholesterol level -     Comprehensive metabolic panel with GFR  Screening for prostate cancer -     PSA  Immunization due -     Flu vaccine HIGH DOSE PF(Fluzone Trivalent)    Return in about  6 months (around 11/02/2024), or if symptoms worsen or fail to improve.  Inform patient was given about smoking.  Advised to quit smoking cigars.  Advised to exercise for at least 30 minutes daily.  Information was given on exercising  to stay healthy.  Encouraged resistance exercises to prevent the sarcopenia associated with age and GLP-1 agonist.  Encouraged follow-up with sleep medicine for recommended sleep study.  He refuses.  Continue current medications.  Elsie Sim Lent, MD

## 2024-05-08 ENCOUNTER — Other Ambulatory Visit: Payer: Self-pay | Admitting: Family Medicine

## 2024-05-08 DIAGNOSIS — E119 Type 2 diabetes mellitus without complications: Secondary | ICD-10-CM

## 2024-05-11 ENCOUNTER — Ambulatory Visit
Admission: RE | Admit: 2024-05-11 | Discharge: 2024-05-11 | Disposition: A | Discharge status: Home / Self Care | Source: Ambulatory Visit

## 2024-05-11 VITALS — BP 132/81 | HR 95 | Temp 98.0°F | Resp 17

## 2024-05-11 DIAGNOSIS — B349 Viral infection, unspecified: Secondary | ICD-10-CM | POA: Diagnosis not present

## 2024-05-11 LAB — POC SOFIA SARS ANTIGEN FIA: SARS Coronavirus 2 Ag: NEGATIVE

## 2024-05-11 MED ORDER — PROMETHAZINE-DM 6.25-15 MG/5ML PO SYRP: 10.0000 mL | ORAL_SOLUTION | Freq: Three times a day (TID) | ORAL | 0 refills | Status: AC | PRN

## 2024-05-11 MED ORDER — AZELASTINE HCL 0.1 % NA SOLN: 1.0000 | Freq: Two times a day (BID) | NASAL | 0 refills | Status: AC

## 2024-05-11 NOTE — ED Triage Notes (Signed)
 Pt c/o cough and congestion for 2 days  He took some mucinex DM at home

## 2024-05-11 NOTE — Discharge Instructions (Signed)
  1. Acute viral syndrome (Primary) - POC SARS Coronavirus Ag completed in UC is negative - azelastine (ASTELIN) 0.1 % nasal spray; Place 1 spray into both nostrils 2 (two) times daily. Use in each nostril as directed  Dispense: 30 mL; Refill: 0 - promethazine-dextromethorphan (PROMETHAZINE-DM) 6.25-15 MG/5ML syrup; Take 10 mLs by mouth 3 (three) times daily as needed for cough.  Dispense: 240 mL; Refill: 0 - Discontinue use of Mucinex DM at this time, Mucinex most likely will not improve symptoms without substantial increase in fluid intake. -Continue to monitor symptoms for any change in severity if there is any escalation of current symptoms or development of new symptoms follow-up in ER or return to UC for further evaluation and management.

## 2024-05-11 NOTE — ED Provider Notes (Signed)
 [facility]  Note:  This document was prepared using Dragon voice recognition software and may include unintentional dictation errors.  MRN: [id number] DOB: 1951/01/10  Subjective:   Norman Bradley is a 73 y.o. male presenting for evaluation of cough and nasal congestion x 2 days.  Patient reports he has been taking Mucinex with minimal improvement to symptoms.  Patient denies any known sick contacts.  No shortness of breath, chest pain, weakness, dizziness, fever, sore throat.  No current facility-administered medications for this encounter.  Current Outpatient Medications:    azelastine (ASTELIN) 0.1 % nasal spray, Place 1 spray into both nostrils 2 (two) times daily. Use in each nostril as directed, Disp: 30 mL, Rfl: 0   promethazine-dextromethorphan (PROMETHAZINE-DM) 6.25-15 MG/5ML syrup, Take 10 mLs by mouth 3 (three) times daily as needed for cough., Disp: 240 mL, Rfl: 0   betamethasone dipropionate 0.05 % lotion, Apply 1 Application topically at bedtime., Disp: , Rfl:    fluocinonide cream (LIDEX) 0.05 %, Apply topically 2 (two) times daily., Disp: , Rfl:    metFORMIN  (GLUCOPHAGE ) 1000 MG tablet, TAKE 1 TABLET(1000 MG) BY MOUTH TWICE DAILY WITH A MEAL, Disp: 180 tablet, Rfl: 0   mometasone (ELOCON) 0.1 % cream, SMARTSIG:sparingly Topical Daily, Disp: , Rfl:    pravastatin  (PRAVACHOL ) 20 MG tablet, TAKE 1 TABLET(20 MG) BY MOUTH DAILY, Disp: 90 tablet, Rfl: 3   Semaglutide  (RYBELSUS ) 14 MG TABS, Take 1 tablet (14 mg total) by mouth daily., Disp: 90 tablet, Rfl: 0   trandolapril  (MAVIK ) 1 MG tablet, TAKE 1 TABLET(1 MG) BY MOUTH DAILY, Disp: 90 tablet, Rfl: 2   No Known Allergies  Past Medical History:  Diagnosis Date   Cataract    Diabetes mellitus without complication (HCC)    History of adenomatous and serrated colon polyps 07/23/2023   Hyperlipidemia      Past Surgical History:  Procedure Laterality Date   COLONOSCOPY  2009   [name],  miralax, excellent prep, 10 yr recall   COLONOSCOPY WITH PROPOFOL   07/21/2023   [name]   STRABISMUS SURGERY      Family History  Problem Relation Age of Onset   Alcohol abuse Mother    Cancer Mother    Depression Mother    Early death Mother    Cancer Brother        leukemia   Diabetes Maternal Grandmother    Diabetes Maternal Grandfather    Hypothyroidism Daughter    Colon cancer Son    Colon polyps Neg Hx    Esophageal cancer Neg Hx    Rectal cancer Neg Hx    Stomach cancer Neg Hx     Social History   Tobacco Use   Smoking status: Some Days    Types: Cigars   Smokeless tobacco: Never  Vaping Use   Vaping status: Never Used  Substance Use Topics   Alcohol use: Yes    Comment: 3-4 glasses per wine once per week   Drug use: No    ROS Refer to HPI for ROS details.  Objective:   Vitals: BP 132/81 (BP Location: Right Arm)   Pulse 95   Temp 98 F (36.7 C) (Oral)   Resp 17   SpO2 95%   Physical Exam Vitals and nursing note reviewed.  Constitutional:      General: He is not in acute distress.    Appearance: Normal appearance. He is well-developed. He is not ill-appearing or toxic-appearing.  HENT:  Head: Normocephalic.     Nose: Congestion present. No rhinorrhea.     Mouth/Throat:     Mouth: Mucous membranes are moist.     Pharynx: Oropharynx is clear. No posterior oropharyngeal erythema.  Cardiovascular:     Rate and Rhythm: Normal rate.  Pulmonary:     Effort: Pulmonary effort is normal. No respiratory distress.     Breath sounds: No stridor. No wheezing.  Skin:    General: Skin is warm and dry.  Neurological:     General: No focal deficit present.     Mental Status: He is alert and oriented to person, place, and time.  Psychiatric:        Mood and Affect: Mood normal.        Behavior: Behavior normal.     Procedures  Results for orders placed or performed during the hospital encounter of 05/11/24 (from the past 24 hours)  POC SARS  Coronavirus Ag     Status: Normal   Collection Time: 05/11/24  3:39 PM  Result Value Ref Range   SARS Coronavirus 2 Ag Negative Negative    No results found.   Assessment and Plan :     Discharge Instructions       1. Acute viral syndrome (Primary) - POC SARS Coronavirus Ag completed in UC is negative - azelastine (ASTELIN) 0.1 % nasal spray; Place 1 spray into both nostrils 2 (two) times daily. Use in each nostril as directed  Dispense: 30 mL; Refill: 0 - promethazine-dextromethorphan (PROMETHAZINE-DM) 6.25-15 MG/5ML syrup; Take 10 mLs by mouth 3 (three) times daily as needed for cough.  Dispense: 240 mL; Refill: 0 - Discontinue use of Mucinex DM at this time, Mucinex most likely will not improve symptoms without substantial increase in fluid intake. -Continue to monitor symptoms for any change in severity if there is any escalation of current symptoms or development of new symptoms follow-up in ER or return to UC for further evaluation and management.      Adajah Cocking B Hoang Reich   Markia Kyer, [place], [place] 05/11/24 1646

## 2024-05-17 ENCOUNTER — Encounter: Payer: Self-pay | Admitting: Family Medicine

## 2024-05-17 NOTE — Progress Notes (Signed)
 Pharmacy Quality Measure Review  This patient is appearing on a report for being at risk of failing the adherence measure for hypertension (ACEi/ARB) medications this calendar year.   Medication: Trandolapril  1mg  Last fill date: 11/05 for 90 day supply  Insurance report was not up to date. No action needed at this time.   Angela Baalmann, PharmD [facility] Pharmacist

## 2024-06-24 ENCOUNTER — Ambulatory Visit
Admission: RE | Admit: 2024-06-24 | Discharge: 2024-06-24 | Disposition: A | Discharge status: Home / Self Care | Attending: Physician Assistant | Admitting: Physician Assistant

## 2024-06-24 ENCOUNTER — Other Ambulatory Visit: Payer: Self-pay

## 2024-06-24 VITALS — BP 116/78 | HR 80 | Temp 98.1°F | Resp 18 | Ht 70.0 in | Wt 220.0 lb

## 2024-06-24 DIAGNOSIS — J3489 Other specified disorders of nose and nasal sinuses: Secondary | ICD-10-CM

## 2024-06-24 DIAGNOSIS — R0989 Other specified symptoms and signs involving the circulatory and respiratory systems: Secondary | ICD-10-CM | POA: Diagnosis not present

## 2024-06-24 MED ORDER — AMOXICILLIN-POT CLAVULANATE 875-125 MG PO TABS: 1.0000 | ORAL_TABLET | Freq: Two times a day (BID) | ORAL | 0 refills | Status: DC

## 2024-06-24 NOTE — ED Provider Notes (Signed)
 [facility]    CSN: [id number] Arrival date & time: 06/24/24  1620      History   Chief Complaint Chief Complaint  Patient presents with   Sinus Pressure     HPI Norman [facility] is a 73 y.o. male.   HPI  Pt is here today with concerns for sinus infection. He reports having sinus pressure and pain along the left side of the face and persistent headache. He states he is getting colored mucus from the nose but denies coughing. He also reports feeling cruddy and states symptoms have been ongoing for about 4 days with out improvement. He denies vision changes but states he has been developing floaters lately. He reports he has an upcoming apt with his eye doctor next week.   Interventions: Ibuprofen, does not provide much relief     Past Medical History:  Diagnosis Date   Cataract    Diabetes mellitus without complication (HCC)    History of adenomatous and serrated colon polyps 07/23/2023   Hyperlipidemia     Patient Active Problem List   Diagnosis Date Noted   Diabetes due to undrl condition w oth diabetic neuro comp (HCC) 11/02/2023   History of adenomatous and serrated colon polyps 07/23/2023   Elevated TSH 10/11/2020   Tremor of left hand 10/17/2019   Essential hypertension 10/17/2019   Vitamin D deficiency 10/17/2019   Dysfunction of left eustachian tube 04/08/2019   Post-traumatic osteoarthritis of right elbow 04/08/2019   Sinus pressure 03/22/2019   Elevated BP without diagnosis of hypertension 06/07/2018   Medication side effect 02/26/2018   Elevated LDL cholesterol level 05/28/2017   Elevated LFTs 05/28/2017   Type 2 diabetes mellitus without complication, without long-term current use of insulin (HCC) 05/27/2017   Screening for prostate cancer 05/27/2017   Snores 05/27/2017   Diabetes (HCC) 04/25/2014    Past Surgical History:  Procedure Laterality Date   COLONOSCOPY  2009   [name], miralax, excellent prep, 10 yr recall    COLONOSCOPY WITH PROPOFOL   07/21/2023   [name]   STRABISMUS SURGERY         Home Medications    Prior to Admission medications  Medication Sig Start Date End Date Taking? Authorizing Provider  amoxicillin -clavulanate (AUGMENTIN ) 875-125 MG tablet Take 1 tablet by mouth every 12 (twelve) hours. 06/29/24  Yes Jaxxson Cavanah E, PA-C  azelastine  (ASTELIN ) 0.1 % nasal spray Place 1 spray into both nostrils 2 (two) times daily. Use in each nostril as directed 05/11/24   [name], [name], NP  betamethasone dipropionate 0.05 % lotion Apply 1 Application topically at bedtime. 11/30/21   [provider]  fluocinonide cream (LIDEX) 0.05 % Apply topically 2 (two) times daily. 03/19/22   [provider]  metFORMIN  (GLUCOPHAGE ) 1000 MG tablet TAKE 1 TABLET(1000 MG) BY MOUTH TWICE DAILY WITH A MEAL 05/02/24   [name], MD  mometasone (ELOCON) 0.1 % cream SMARTSIG:sparingly Topical Daily 03/19/22   [provider]  pravastatin  (PRAVACHOL ) 20 MG tablet TAKE 1 TABLET(20 MG) BY MOUTH DAILY 10/22/23   [name], MD  promethazine -dextromethorphan (PROMETHAZINE -DM) 6.25-15 MG/5ML syrup Take 10 mLs by mouth 3 (three) times daily as needed for cough. 05/11/24   [name], [name], NP  Semaglutide  (RYBELSUS ) 14 MG TABS Take 1 tablet (14 mg total) by mouth daily. 05/05/24   [name], MD  trandolapril  (MAVIK ) 1 MG tablet TAKE 1 TABLET(1 MG) BY MOUTH DAILY 05/04/24   [name],  MD    Family History Family History  Problem Relation Age of Onset   Alcohol abuse Mother    Cancer Mother    Depression Mother    Early death Mother    Cancer Brother        leukemia   Diabetes Maternal Grandmother    Diabetes Maternal Grandfather    Hypothyroidism Daughter    Colon cancer Son    Colon polyps Neg Hx    Esophageal cancer Neg Hx    Rectal cancer Neg Hx    Stomach cancer Neg Hx     Social History Social History[1]   Allergies    Patient has no known allergies.   Review of Systems Review of Systems  Constitutional:  Negative for chills and fever.  HENT:  Positive for congestion, sinus pressure and sinus pain. Negative for ear pain and sore throat.   Eyes:  Negative for visual disturbance.  Respiratory:  Negative for cough, shortness of breath and wheezing.   Gastrointestinal:  Negative for diarrhea, nausea and vomiting.  Musculoskeletal:  Negative for myalgias.  Neurological:  Positive for headaches.     Physical Exam Triage Vital Signs ED Triage Vitals  Encounter Vitals Group     BP 06/24/24 1747 116/78     Girls Systolic BP Percentile --      Girls Diastolic BP Percentile --      Boys Systolic BP Percentile --      Boys Diastolic BP Percentile --      Pulse Rate 06/24/24 1747 80     Resp 06/24/24 1747 18     Temp 06/24/24 1747 98.1 F (36.7 C)     Temp Source 06/24/24 1747 Oral     SpO2 06/24/24 1747 95 %     Weight 06/24/24 1745 220 lb (99.8 kg)     Height 06/24/24 1745 5' 10 (1.778 m)     Head Circumference --      Peak Flow --      Pain Score 06/24/24 1745 3     Pain Loc --      Pain Education --      Exclude from Growth Chart --    No data found.  Updated Vital Signs BP 116/78 (BP Location: Right Arm)   Pulse 80   Temp 98.1 F (36.7 C) (Oral)   Resp 18   Ht 5' 10 (1.778 m)   Wt 220 lb (99.8 kg)   SpO2 95%   BMI 31.57 kg/m   Visual Acuity Right Eye Distance:   Left Eye Distance:   Bilateral Distance:    Right Eye Near:   Left Eye Near:    Bilateral Near:     Physical Exam Vitals reviewed.  Constitutional:      General: He is awake.     Appearance: Normal appearance. He is well-developed and well-groomed.  HENT:     Head: Normocephalic and atraumatic.     Right Ear: Hearing, tympanic membrane and ear canal normal.     Left Ear: Hearing, tympanic membrane and ear canal normal.     Nose:     Right Sinus: No maxillary sinus tenderness or frontal sinus tenderness.      Left Sinus: No maxillary sinus tenderness or frontal sinus tenderness.     Comments: No obvious tenderness with palpation of the frontal and maxillary sinuses.  No obvious bogginess or swelling along these areas    Mouth/Throat:     Lips: Pink.  Mouth: Mucous membranes are moist.     Pharynx: Oropharynx is clear. Uvula midline. No pharyngeal swelling, oropharyngeal exudate, posterior oropharyngeal erythema, uvula swelling or postnasal drip.     Tonsils: No tonsillar exudate or tonsillar abscesses.  Eyes:     General: Lids are normal. Gaze aligned appropriately.     Extraocular Movements: Extraocular movements intact.     Conjunctiva/sclera: Conjunctivae normal.  Cardiovascular:     Rate and Rhythm: Normal rate and regular rhythm.     Heart sounds: Normal heart sounds.  Pulmonary:     Effort: Pulmonary effort is normal.     Breath sounds: Normal breath sounds. No decreased air movement. No decreased breath sounds, wheezing, rhonchi or rales.  Musculoskeletal:     Cervical back: Normal range of motion and neck supple.  Lymphadenopathy:     Head:     Right side of head: No submental, submandibular or preauricular adenopathy.     Left side of head: No submental, submandibular or preauricular adenopathy.     Cervical:     Right cervical: No superficial cervical adenopathy.    Left cervical: No superficial cervical adenopathy.     Upper Body:     Right upper body: No supraclavicular adenopathy.     Left upper body: No supraclavicular adenopathy.  Skin:    General: Skin is warm and dry.  Neurological:     General: No focal deficit present.     Mental Status: He is alert and oriented to person, place, and time.  Psychiatric:        Attention and Perception: Attention normal.        Mood and Affect: Mood normal.        Speech: Speech normal.        Behavior: Behavior normal. Behavior is cooperative.        Thought Content: Thought content normal.        Judgment: Judgment normal.       UC Treatments / Results  Labs (all labs ordered are listed, but only abnormal results are displayed) Labs Reviewed - No data to display  EKG   Radiology No results found.  Procedures Procedures (including critical care time)  Medications Ordered in UC Medications - No data to display  Initial Impression / Assessment and Plan / UC Course  I have reviewed the triage vital signs and the nursing notes.  Pertinent labs & imaging results that were available during my care of the patient were reviewed by me and considered in my medical decision making (see chart for details).      Final Clinical Impressions(s) / UC Diagnoses   Final diagnoses:  Sinus pressure  Symptoms of upper respiratory infection (URI)   Patient presents today with concerns of sinus pain and pressure, headache as well as nasal drainage has been ongoing for about 4 days without improvement.  He has tried taking ibuprofen for symptoms but this has not provided much relief.  Physical exam is largely reassuring and I suspect patient likely has a viral URI versus sinusitis.  Patient reports that his symptoms are unusual for him and is concerned for a bacterial sinus infection.  I reviewed signs and symptoms of a bacterial sinus infection with patient and for reassurance provided a future fill prescription for Augmentin  should his symptoms persist for another 5 to 7 days.  Reviewed with patient that he can optimize his OTC medications to assist with his symptoms by using Flonase, Mucinex, Robitussin and alternating Tylenol and  ibuprofen as needed for pain and fever control.  Patient does voice concerns for Flonase stating that this caused his nose to bleed so recommend sinus flushes and humidifiers instead.  Follow-up as needed for progressing or persistent symptoms    Discharge Instructions      Based on your described symptoms and the duration of symptoms it is likely that you have a viral upper respiratory  infection (often called a cold)  Symptoms can last for 3-10 days with lingering cough and intermittent symptoms potentially  lasting several  weeks after that.  The goal of treatment at this time is to reduce your symptoms and discomfort   You can use the following medications and measures to help yourself feel better until your body fights this off: DayQuil/NyQuil, TheraFlu, Alka-Seltzer  (these medications typically have the same active ingredients in them so you can choose whichever one you prefer and take consistently during the day and night according to the manufactures instructions.) Flonase A daily antihistamine such as Zyrtec, Claritin, Allegra per your preference.  Please choose 1 and take consistently. Increased fluids.  It is recommended that you take in at least 64 ounces of water per day when you are not sick so it is important to increase this when you are sick and your body may be running fever. Rest Cough drops Chloraseptic throat spray to help with sore throat Nasal saline spray or nasal flushes to help with congestion and runny nose  If your symptoms seem like they are getting worse over the next 5 to 7 days or not improving you can always follow-up here in urgent care or go to your primary care provider for further management. Go to the ER if you begin to have more serious symptoms such as shortness of breath, trouble breathing, loss of consciousness, swelling around the eyes, high fever, severe lasting headaches, vision changes or neck pain/stiffness.       ED Prescriptions     Medication Sig Dispense Auth. Provider   amoxicillin -clavulanate (AUGMENTIN ) 875-125 MG tablet Take 1 tablet by mouth every 12 (twelve) hours. 14 tablet Georgianne Gritz E, PA-C      PDMP not reviewed this encounter.     [1]  Social History Tobacco Use   Smoking status: Some Days    Types: Cigars   Smokeless tobacco: Never  Vaping Use   Vaping status: Never Used  Substance Use Topics    Alcohol use: Yes    Comment: 3-4 glasses per wine once per week   Drug use: No     Trinity Haun, Rocky BRAVO, PA-C 06/24/24 1922  "

## 2024-06-24 NOTE — ED Triage Notes (Signed)
 Pt presents with a chief complaint of left-sided sinus pressure x 4-5 days. Currently rates overall pain/discomfort a 3/10. OTC Ibuprofen taken for symptoms with improvement/relief. No fevers.

## 2024-06-24 NOTE — Discharge Instructions (Signed)

## 2024-06-26 ENCOUNTER — Ambulatory Visit
Admission: EM | Admit: 2024-06-26 | Discharge: 2024-06-26 | Disposition: A | Discharge status: Home / Self Care | Attending: Physician Assistant | Admitting: Physician Assistant

## 2024-06-26 ENCOUNTER — Other Ambulatory Visit: Payer: Self-pay

## 2024-06-26 DIAGNOSIS — B029 Zoster without complications: Secondary | ICD-10-CM

## 2024-06-26 MED ORDER — GABAPENTIN 100 MG PO CAPS: 100.0000 mg | ORAL_CAPSULE | Freq: Three times a day (TID) | ORAL | 0 refills | Status: AC | PRN

## 2024-06-26 MED ORDER — VALACYCLOVIR HCL 1 G PO TABS: 1000.0000 mg | ORAL_TABLET | Freq: Three times a day (TID) | ORAL | 0 refills | Status: AC

## 2024-06-26 NOTE — ED Provider Notes (Addendum)
 [name]    CSN: [id number] Arrival date & time: 06/26/24  9171      History   Chief Complaint Chief Complaint  Patient presents with   Skin Problem    HPI [facility] is a 73 y.o. male.   HPI  Pt is here today with concerns for shingles on the left side of his face and head. He was seen at this UC on 06/24/24 for sinus pressure and head pain which was thought to be a sinus infection. He started to develop a rash yesterday and more intense pain along the left side of the face. He currently denies vision changes but is worried that the rash is extending towards his left eye.    Past Medical History:  Diagnosis Date   Cataract    Diabetes mellitus without complication (HCC)    History of adenomatous and serrated colon polyps 07/23/2023   Hyperlipidemia     Patient Active Problem List   Diagnosis Date Noted   Diabetes due to undrl condition w oth diabetic neuro comp (HCC) 11/02/2023   History of adenomatous and serrated colon polyps 07/23/2023   Elevated TSH 10/11/2020   Tremor of left hand 10/17/2019   Essential hypertension 10/17/2019   Vitamin D deficiency 10/17/2019   Dysfunction of left eustachian tube 04/08/2019   Post-traumatic osteoarthritis of right elbow 04/08/2019   Sinus pressure 03/22/2019   Elevated BP without diagnosis of hypertension 06/07/2018   Medication side effect 02/26/2018   Elevated LDL cholesterol level 05/28/2017   Elevated LFTs 05/28/2017   Type 2 diabetes mellitus without complication, without long-term current use of insulin (HCC) 05/27/2017   Screening for prostate cancer 05/27/2017   Snores 05/27/2017   Diabetes (HCC) 04/25/2014    Past Surgical History:  Procedure Laterality Date   COLONOSCOPY  2009   [facility], excellent prep, 10 yr recall   COLONOSCOPY WITH PROPOFOL   07/21/2023   [facility]   STRABISMUS SURGERY         Home Medications    Prior to Admission medications  Medication  Sig Start Date End Date Taking? Authorizing Provider  gabapentin  (NEURONTIN ) 100 MG capsule Take 1 capsule (100 mg total) by mouth 3 (three) times daily as needed. 06/26/24 07/03/24 Yes Michah Minton E, PA-C  valACYclovir  (VALTREX ) 1000 MG tablet Take 1 tablet (1,000 mg total) by mouth 3 (three) times daily for 14 days. 06/26/24 07/10/24 Yes Brenya Taulbee E, PA-C  azelastine  (ASTELIN ) 0.1 % nasal spray Place 1 spray into both nostrils 2 (two) times daily. Use in each nostril as directed 05/11/24   [name], [name], NP  betamethasone dipropionate 0.05 % lotion Apply 1 Application topically at bedtime. 11/30/21   [provider]  fluocinonide cream (LIDEX) 0.05 % Apply topically 2 (two) times daily. 03/19/22   [provider]  metFORMIN  (GLUCOPHAGE ) 1000 MG tablet TAKE 1 TABLET(1000 MG) BY MOUTH TWICE DAILY WITH A MEAL 05/02/24   [facility] Elsie Sayre, MD  mometasone (ELOCON) 0.1 % cream SMARTSIG:sparingly Topical Daily 03/19/22   [provider]  pravastatin  (PRAVACHOL ) 20 MG tablet TAKE 1 TABLET(20 MG) BY MOUTH DAILY 10/22/23   [facility] Elsie Sayre, MD  promethazine -dextromethorphan (PROMETHAZINE -DM) 6.25-15 MG/5ML syrup Take 10 mLs by mouth 3 (three) times daily as needed for cough. 05/11/24   [name], [name], NP  Semaglutide  (RYBELSUS ) 14 MG TABS Take 1 tablet (14 mg total) by mouth daily. 05/05/24   [facility] Elsie Sayre, MD  trandolapril  (MAVIK ) 1 MG  tablet TAKE 1 TABLET(1 MG) BY MOUTH DAILY 05/04/24   [facility] Sayre, MD    Family History Family History  Problem Relation Age of Onset   Alcohol abuse Mother    Cancer Mother    Depression Mother    Early death Mother    Cancer Brother        leukemia   Diabetes Maternal Grandmother    Diabetes Maternal Grandfather    Hypothyroidism Daughter    Colon cancer Son    Colon polyps Neg Hx    Esophageal cancer Neg Hx    Rectal cancer Neg Hx    Stomach cancer Neg Hx     Social History Social  History[1]   Allergies   Patient has no known allergies.   Review of Systems Review of Systems  Constitutional:  Negative for chills and fever.  Skin:  Positive for rash.  Neurological:  Positive for headaches.     Physical Exam Triage Vital Signs ED Triage Vitals  Encounter Vitals Group     BP 06/26/24 0851 133/88     Girls Systolic BP Percentile --      Girls Diastolic BP Percentile --      Boys Systolic BP Percentile --      Boys Diastolic BP Percentile --      Pulse Rate 06/26/24 0851 80     Resp 06/26/24 0851 18     Temp 06/26/24 0851 97.8 F (36.6 C)     Temp Source 06/26/24 0851 Oral     SpO2 06/26/24 0851 96 %     Weight 06/26/24 0854 220 lb 0.3 oz (99.8 kg)     Height 06/26/24 0854 5' 10 (1.778 m)     Head Circumference --      Peak Flow --      Pain Score 06/26/24 0849 8     Pain Loc --      Pain Education --      Exclude from Growth Chart --    No data found.  Updated Vital Signs BP 133/88 (BP Location: Right Arm)   Pulse 80   Temp 97.8 F (36.6 C) (Oral)   Resp 18   Ht 5' 10 (1.778 m)   Wt 220 lb 0.3 oz (99.8 kg)   SpO2 96%   BMI 31.57 kg/m   Visual Acuity Right Eye Distance:   Left Eye Distance:   Bilateral Distance:    Right Eye Near:   Left Eye Near:    Bilateral Near:     Physical Exam Vitals reviewed.  Constitutional:      General: He is awake.     Appearance: Normal appearance. He is well-developed and well-groomed.  HENT:     Head: Normocephalic and atraumatic.   Eyes:     General: Gaze aligned appropriately.     Extraocular Movements: Extraocular movements intact.     Conjunctiva/sclera: Conjunctivae normal.  Pulmonary:     Effort: Pulmonary effort is normal.  Musculoskeletal:     Cervical back: Normal range of motion.  Neurological:     Mental Status: He is alert and oriented to person, place, and time.  Psychiatric:        Attention and Perception: Attention normal.        Mood and Affect: Mood normal.         Speech: Speech normal.        Behavior: Behavior normal. Behavior is cooperative.      UC Treatments / Results  Labs (all labs ordered are listed, but only abnormal results are displayed) Labs Reviewed - No data to display  EKG   Radiology No results found.  Procedures Procedures (including critical care time)  Medications Ordered in UC Medications - No data to display  Initial Impression / Assessment and Plan / UC Course  I have reviewed the triage vital signs and the nursing notes.  Pertinent labs & imaging results that were available during my care of the patient were reviewed by me and considered in my medical decision making (see chart for details).      Final Clinical Impressions(s) / UC Diagnoses   Final diagnoses:  Herpes zoster without complication   Patient presents today with concerns for a maculopapular rash on the left side of the face extending up to the scalp that started yesterday.  He states that this is extremely painful and he has not been able to sleep very much.  Physical exam reveals a maculopapular rash with single vesicular area and symptoms appear consistent with likely shingles.  The rash does appear to be approaching the left eye area but no obvious signs of ocular involvement today.  Will start patient on Valtrex  p.o. 3 times daily x 7 to 10 days.  Patient states that he has been trying OTC pain relievers but this has not provided relief so we will try limited prescription of gabapentin  100 mg p.o. 3 times daily as needed to assist with nerve pain.  Patient does have an appointment with an ophthalmologist tomorrow for a preplanned eye injection.  Recommend that he discusses his symptoms with them for further evaluation for potential herpes zoster ophthalmicus.  Reviewed signs and symptoms of secondary bacterial infection as well as ocular involvement with patient.  He voices agreement understanding with management plan as well as follow-up.  Will  discontinue Augmentin  prescribed at appointment on 06/24/2024 as his symptoms do not appear consistent with a sinus infection anymore.  Patient states that he has not picked this up yet as it was future dated to be picked up on 06/27/2024.  Follow-up as needed.    Discharge Instructions      You are seen today for concerns of shingles along the left side of your face and head.  The rash that you were experiencing does appear consistent with early shingles.  We are starting you on an antiviral medication called Valtrex  for you to take by mouth 3 times a day for 7 to 10 days.  I am also starting you on a medication called gabapentin  to help with the nerve pain.  Please be advised that this can cause drowsiness and sedation so please do not take it if you need to remain alert or drive.  Please follow-up with your ophthalmologist as planned so they can examine your eyes and make sure that she did not need additional medications.     ED Prescriptions     Medication Sig Dispense Auth. Provider   valACYclovir  (VALTREX ) 1000 MG tablet Take 1 tablet (1,000 mg total) by mouth 3 (three) times daily for 14 days. 42 tablet Ilayda Toda E, PA-C   gabapentin  (NEURONTIN ) 100 MG capsule Take 1 capsule (100 mg total) by mouth 3 (three) times daily as needed. 21 capsule Kariana Wiles E, PA-C      PDMP not reviewed this encounter.    Trexton Escamilla, Rocky BRAVO, PA-C 06/26/24 1024     [1]  Social History Tobacco Use   Smoking status: Some Days  Types: Cigars   Smokeless tobacco: Never  Vaping Use   Vaping status: Never Used  Substance Use Topics   Alcohol use: Yes    Comment: 3-4 glasses per wine once per week   Drug use: No     Ashtian Villacis, Rocky BRAVO, PA-C 06/26/24 1025  "

## 2024-06-26 NOTE — ED Triage Notes (Addendum)
 Pt presents to urgent care for possible shingles on left side of face. Appears to be in the scalp area, spreading to the left eye. Denies blurred vision. Was here on 12/26 for pain/pressure in this area. Noticed rash yesterday. Currently rates pain as an 8/10. OTC Ibuprofen taken for symptoms with no improvement.

## 2024-06-26 NOTE — Discharge Instructions (Addendum)
 You are seen today for concerns of shingles along the left side of your face and head.  The rash that you were experiencing does appear consistent with early shingles.  We are starting you on an antiviral medication called Valtrex  for you to take by mouth 3 times a day for 7 to 10 days.  I am also starting you on a medication called gabapentin  to help with the nerve pain.  Please be advised that this can cause drowsiness and sedation so please do not take it if you need to remain alert or drive.  Please follow-up with your ophthalmologist as planned so they can examine your eyes and make sure that she did not need additional medications.

## 2024-06-27 ENCOUNTER — Ambulatory Visit

## 2024-06-27 ENCOUNTER — Encounter: Payer: Self-pay | Admitting: Family Medicine

## 2024-06-28 ENCOUNTER — Ambulatory Visit: Admitting: Family Medicine

## 2024-06-29 NOTE — Telephone Encounter (Signed)
 Contact pt regarding advice to proceed to ER. Pt states that he has already got the issue resolved.

## 2024-07-04 ENCOUNTER — Ambulatory Visit
Admission: RE | Admit: 2024-07-04 | Discharge: 2024-07-04 | Disposition: A | Discharge status: Home / Self Care | Attending: Physician Assistant | Admitting: Physician Assistant

## 2024-07-04 ENCOUNTER — Other Ambulatory Visit: Payer: Self-pay

## 2024-07-04 VITALS — BP 118/82 | HR 109 | Temp 98.7°F | Resp 18

## 2024-07-04 DIAGNOSIS — R051 Acute cough: Secondary | ICD-10-CM

## 2024-07-04 DIAGNOSIS — J9801 Acute bronchospasm: Secondary | ICD-10-CM

## 2024-07-04 LAB — POC SOFIA SARS ANTIGEN FIA: SARS Coronavirus 2 Ag: NEGATIVE

## 2024-07-04 LAB — POCT INFLUENZA A/B
Influenza A, POC: NEGATIVE
Influenza B, POC: NEGATIVE

## 2024-07-04 MED ORDER — ALBUTEROL SULFATE HFA 108 (90 BASE) MCG/ACT IN AERS: 1.0000 | INHALATION_SPRAY | Freq: Four times a day (QID) | RESPIRATORY_TRACT | 0 refills | Status: AC | PRN

## 2024-07-04 NOTE — ED Provider Notes (Signed)
 [name]    CSN: [id number] Arrival date & time: 07/04/24  0934      History   Chief Complaint Chief Complaint  Patient presents with   Cough    Entered by patient    HPI Haleem Hanner is a 74 y.o. male.   HPI  Pt is here today with concerns of cough/cold symptoms that started 2 days ago. He reports cough is predominant symptom and is mostly dry. He reports chest pain in the center of the chest with coughing.  Interventions: he has been taking promethazine -DM to help with the coughing but states this does make him drowsy.     Past Medical History:  Diagnosis Date   Cataract    Diabetes mellitus without complication (HCC)    History of adenomatous and serrated colon polyps 07/23/2023   Hyperlipidemia     Patient Active Problem List   Diagnosis Date Noted   Diabetes due to undrl condition w oth diabetic neuro comp (HCC) 11/02/2023   History of adenomatous and serrated colon polyps 07/23/2023   Elevated TSH 10/11/2020   Tremor of left hand 10/17/2019   Essential hypertension 10/17/2019   Vitamin D deficiency 10/17/2019   Dysfunction of left eustachian tube 04/08/2019   Post-traumatic osteoarthritis of right elbow 04/08/2019   Sinus pressure 03/22/2019   Elevated BP without diagnosis of hypertension 06/07/2018   Medication side effect 02/26/2018   Elevated LDL cholesterol level 05/28/2017   Elevated LFTs 05/28/2017   Type 2 diabetes mellitus without complication, without long-term current use of insulin (HCC) 05/27/2017   Screening for prostate cancer 05/27/2017   Snores 05/27/2017   Diabetes (HCC) 04/25/2014    Past Surgical History:  Procedure Laterality Date   COLONOSCOPY  2009   [name], miralax, excellent prep, 10 yr recall   COLONOSCOPY WITH PROPOFOL   07/21/2023   [name]   STRABISMUS SURGERY         Home Medications    Prior to Admission medications  Medication Sig Start Date End Date Taking? Authorizing Provider   albuterol  (VENTOLIN  HFA) 108 (90 Base) MCG/ACT inhaler Inhale 1-2 puffs into the lungs every 6 (six) hours as needed for wheezing or shortness of breath. 07/04/24  Yes Tyrion Glaude E, PA-C  azelastine  (ASTELIN ) 0.1 % nasal spray Place 1 spray into both nostrils 2 (two) times daily. Use in each nostril as directed 05/11/24   [name], [name], NP  betamethasone dipropionate 0.05 % lotion Apply 1 Application topically at bedtime. 11/30/21   [provider]  fluocinonide cream (LIDEX) 0.05 % Apply topically 2 (two) times daily. 03/19/22   [provider]  gabapentin  (NEURONTIN ) 100 MG capsule Take 1 capsule (100 mg total) by mouth 3 (three) times daily as needed. 06/26/24 07/03/24  Lashina Milles E, PA-C  metFORMIN  (GLUCOPHAGE ) 1000 MG tablet TAKE 1 TABLET(1000 MG) BY MOUTH TWICE DAILY WITH A MEAL 05/02/24   [name], MD  mometasone (ELOCON) 0.1 % cream SMARTSIG:sparingly Topical Daily 03/19/22   [provider]  pravastatin  (PRAVACHOL ) 20 MG tablet TAKE 1 TABLET(20 MG) BY MOUTH DAILY 10/22/23   [name], MD  promethazine -dextromethorphan (PROMETHAZINE -DM) 6.25-15 MG/5ML syrup Take 10 mLs by mouth 3 (three) times daily as needed for cough. 05/11/24   [name], [name], NP  Semaglutide  (RYBELSUS ) 14 MG TABS Take 1 tablet (14 mg total) by mouth daily. 05/05/24   [name], MD  trandolapril  (MAVIK ) 1 MG tablet TAKE 1 TABLET(1 MG) BY MOUTH  DAILY 05/04/24   [name], MD  valACYclovir  (VALTREX ) 1000 MG tablet Take 1 tablet (1,000 mg total) by mouth 3 (three) times daily for 14 days. 06/26/24 07/10/24  Dmauri Rosenow, Rocky BRAVO, PA-C    Family History Family History  Problem Relation Age of Onset   Alcohol abuse Mother    Cancer Mother    Depression Mother    Early death Mother    Cancer Brother        leukemia   Diabetes Maternal Grandmother    Diabetes Maternal Grandfather    Hypothyroidism Daughter    Colon cancer Son    Colon polyps  Neg Hx    Esophageal cancer Neg Hx    Rectal cancer Neg Hx    Stomach cancer Neg Hx     Social History Social History[1]   Allergies   Patient has no known allergies.   Review of Systems Review of Systems  Constitutional:  Negative for chills and fever.  HENT:  Positive for congestion. Negative for ear pain, postnasal drip, rhinorrhea, sinus pressure, sinus pain and sore throat.   Respiratory:  Positive for cough and chest tightness. Negative for shortness of breath and wheezing.   Gastrointestinal:  Negative for diarrhea, nausea and vomiting.  Musculoskeletal:  Positive for myalgias.     Physical Exam Triage Vital Signs ED Triage Vitals  Encounter Vitals Group     BP 07/04/24 1000 118/82     Girls Systolic BP Percentile --      Girls Diastolic BP Percentile --      Boys Systolic BP Percentile --      Boys Diastolic BP Percentile --      Pulse Rate 07/04/24 1000 (!) 109     Resp 07/04/24 1000 18     Temp 07/04/24 1000 98.7 F (37.1 C)     Temp Source 07/04/24 1000 Oral     SpO2 07/04/24 1000 94 %     Weight --      Height --      Head Circumference --      Peak Flow --      Pain Score 07/04/24 0959 6     Pain Loc --      Pain Education --      Exclude from Growth Chart --    No data found.  Updated Vital Signs BP 118/82 (BP Location: Right Arm)   Pulse (!) 109   Temp 98.7 F (37.1 C) (Oral)   Resp 18   SpO2 94%   Visual Acuity Right Eye Distance:   Left Eye Distance:   Bilateral Distance:    Right Eye Near:   Left Eye Near:    Bilateral Near:     Physical Exam Vitals reviewed.  Constitutional:      General: He is awake.     Appearance: Normal appearance. He is well-developed and well-groomed.  HENT:     Head: Normocephalic and atraumatic.     Right Ear: Hearing, tympanic membrane and ear canal normal.     Left Ear: Hearing, tympanic membrane and ear canal normal.     Mouth/Throat:     Lips: Pink.     Mouth: Mucous membranes are moist.      Pharynx: Oropharynx is clear. Uvula midline. No pharyngeal swelling, oropharyngeal exudate, posterior oropharyngeal erythema, uvula swelling or postnasal drip.     Tonsils: No tonsillar exudate or tonsillar abscesses.  Eyes:     General: Lids are normal. Gaze aligned appropriately.  Extraocular Movements: Extraocular movements intact.  Cardiovascular:     Rate and Rhythm: Normal rate and regular rhythm.     Heart sounds: Normal heart sounds.  Pulmonary:     Effort: Pulmonary effort is normal.     Breath sounds: Normal breath sounds. No decreased air movement. No decreased breath sounds, wheezing, rhonchi or rales.  Musculoskeletal:     Cervical back: Normal range of motion and neck supple.  Lymphadenopathy:     Head:     Right side of head: No submental, submandibular or preauricular adenopathy.     Left side of head: No submental, submandibular or preauricular adenopathy.     Cervical:     Right cervical: No superficial cervical adenopathy.    Left cervical: No superficial cervical adenopathy.     Upper Body:     Right upper body: No supraclavicular adenopathy.     Left upper body: No supraclavicular adenopathy.  Skin:    General: Skin is warm and dry.  Neurological:     General: No focal deficit present.     Mental Status: He is alert and oriented to person, place, and time.  Psychiatric:        Mood and Affect: Mood normal.        Behavior: Behavior normal. Behavior is cooperative.        Thought Content: Thought content normal.        Judgment: Judgment normal.      UC Treatments / Results  Labs (all labs ordered are listed, but only abnormal results are displayed) Labs Reviewed  POCT INFLUENZA A/B  POC SOFIA SARS ANTIGEN FIA    EKG   Radiology No results found.  Procedures Procedures (including critical care time)  Medications Ordered in UC Medications - No data to display  Initial Impression / Assessment and Plan / UC Course  I have reviewed the  triage vital signs and the nursing notes.  Pertinent labs & imaging results that were available during my care of the patient were reviewed by me and considered in my medical decision making (see chart for details).      Final Clinical Impressions(s) / UC Diagnoses   Final diagnoses:  Acute cough  Cough due to bronchospasm   Patient is here with concerns of an acute, dry cough that is been ongoing for 3 to 4 days.  He reports some posterior mid chest pain during coughing spells but denies shortness of breath, wheezing or outright chest pain.  Physical exam and vitals are largely reassuring.  Testing was negative for COVID and flu.  At this time I suspect cough due to bronchospasm or developing URI.  For now recommend switching from promethazine -dextromethorphan to OTC cough syrup such as Robitussin or Mucinex.  Will send albuterol  rescue inhaler to assist with pulmonary inflammation and hopefully reduce coughing spells.  ED and return precautions reviewed and provided in AVS.  Follow-up as needed.    Discharge Instructions      You are seen today for concerns of a persistent, dry cough.  This time I suspect that your symptoms are likely due to a bronchospasm that is aggravating your coughing reflex.  Your testing was negative for COVID, flu. To help with your symptoms I recommend continuing with an over-the-counter cough syrup such as Robitussin or Mucinex.  I am sending in an albuterol  rescue inhaler for you to use up to every 6 hours as needed for coughing spells, shortness of breath and wheezing.  This usually  helps reduce the inflammation that is causing the irritation leading to coughing. If needed you can alternate Tylenol and ibuprofen for fever and pain control. If you feel like your symptoms are getting worse, you start to develop more severe symptoms such as difficulty breathing, fevers that are not responding to Tylenol and ibuprofen, confusion, loss of consciousness please go to  the emergency room.     ED Prescriptions     Medication Sig Dispense Auth. Provider   albuterol  (VENTOLIN  HFA) 108 (90 Base) MCG/ACT inhaler Inhale 1-2 puffs into the lungs every 6 (six) hours as needed for wheezing or shortness of breath. 8 g Torrence Hammack E, PA-C      PDMP not reviewed this encounter.     [1]  Social History Tobacco Use   Smoking status: Some Days    Types: Cigars   Smokeless tobacco: Never  Vaping Use   Vaping status: Never Used  Substance Use Topics   Alcohol use: Yes    Comment: 3-4 glasses per wine once per week   Drug use: No     Decklyn Hornik, Rocky BRAVO, PA-C 07/04/24 1034  "

## 2024-07-04 NOTE — ED Triage Notes (Signed)
 Pt presents with a chief complaint of cough x 3-4 days. No fevers. Pain is only present in anterior mid-chest area when coughing. Denies other symptoms. Has been taking leftover prescribed cough syrup from a previous visit with some improvement.

## 2024-07-04 NOTE — Discharge Instructions (Addendum)
 You are seen today for concerns of a persistent, dry cough.  This time I suspect that your symptoms are likely due to a bronchospasm that is aggravating your coughing reflex.  Your testing was negative for COVID, flu. To help with your symptoms I recommend continuing with an over-the-counter cough syrup such as Robitussin or Mucinex.  I am sending in an albuterol  rescue inhaler for you to use up to every 6 hours as needed for coughing spells, shortness of breath and wheezing.  This usually helps reduce the inflammation that is causing the irritation leading to coughing. If needed you can alternate Tylenol and ibuprofen for fever and pain control. If you feel like your symptoms are getting worse, you start to develop more severe symptoms such as difficulty breathing, fevers that are not responding to Tylenol and ibuprofen, confusion, loss of consciousness please go to the emergency room.

## 2024-08-05 ENCOUNTER — Other Ambulatory Visit: Payer: Self-pay | Admitting: Family Medicine

## 2024-08-05 DIAGNOSIS — E119 Type 2 diabetes mellitus without complications: Secondary | ICD-10-CM

## 2024-11-03 ENCOUNTER — Ambulatory Visit: Admitting: Family Medicine
